# Patient Record
Sex: Female | Born: 1980 | Race: Black or African American | Hispanic: No | Marital: Single | Smoking: Current some day smoker
Health system: Southern US, Community
[De-identification: ages and names within clinical notes are randomized; demographics above are authoritative.]

## PROBLEM LIST (undated history)

## (undated) DIAGNOSIS — Z789 Other specified health status: Secondary | ICD-10-CM

## (undated) HISTORY — PX: INDUCED ABORTION: SHX677

## (undated) HISTORY — PX: FOOT SURGERY: SHX648

---
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
## 1999-01-01 ENCOUNTER — Ambulatory Visit (HOSPITAL_COMMUNITY): Admission: RE | Admit: 1999-01-01 | Discharge: 1999-01-01 | Payer: Self-pay | Admitting: *Deleted

## 1999-01-26 ENCOUNTER — Inpatient Hospital Stay (HOSPITAL_COMMUNITY): Admission: AD | Admit: 1999-01-26 | Discharge: 1999-01-26 | Payer: Self-pay | Admitting: Obstetrics

## 1999-02-01 ENCOUNTER — Inpatient Hospital Stay (HOSPITAL_COMMUNITY): Admission: AD | Admit: 1999-02-01 | Discharge: 1999-02-01 | Payer: Self-pay | Admitting: Obstetrics

## 1999-02-13 ENCOUNTER — Inpatient Hospital Stay (HOSPITAL_COMMUNITY): Admission: AD | Admit: 1999-02-13 | Discharge: 1999-02-13 | Payer: Self-pay | Admitting: *Deleted

## 1999-03-17 ENCOUNTER — Inpatient Hospital Stay (HOSPITAL_COMMUNITY): Admission: AD | Admit: 1999-03-17 | Discharge: 1999-03-19 | Payer: Self-pay | Admitting: Obstetrics & Gynecology

## 1999-06-23 ENCOUNTER — Emergency Department (HOSPITAL_COMMUNITY): Admission: EM | Admit: 1999-06-23 | Discharge: 1999-06-23 | Payer: Self-pay | Admitting: Emergency Medicine

## 2000-04-18 ENCOUNTER — Emergency Department (HOSPITAL_COMMUNITY): Admission: EM | Admit: 2000-04-18 | Discharge: 2000-04-18 | Payer: Self-pay | Admitting: Emergency Medicine

## 2000-04-19 ENCOUNTER — Encounter: Payer: Self-pay | Admitting: Emergency Medicine

## 2000-04-19 ENCOUNTER — Emergency Department (HOSPITAL_COMMUNITY): Admission: EM | Admit: 2000-04-19 | Discharge: 2000-04-19 | Payer: Self-pay | Admitting: *Deleted

## 2000-04-21 ENCOUNTER — Emergency Department (HOSPITAL_COMMUNITY): Admission: EM | Admit: 2000-04-21 | Discharge: 2000-04-21 | Payer: Self-pay | Admitting: Emergency Medicine

## 2001-02-09 ENCOUNTER — Emergency Department (HOSPITAL_COMMUNITY): Admission: EM | Admit: 2001-02-09 | Discharge: 2001-02-09 | Payer: Self-pay

## 2004-01-05 ENCOUNTER — Emergency Department (HOSPITAL_COMMUNITY): Admission: EM | Admit: 2004-01-05 | Discharge: 2004-01-05 | Payer: Self-pay | Admitting: Emergency Medicine

## 2004-04-19 ENCOUNTER — Ambulatory Visit (HOSPITAL_COMMUNITY): Admission: RE | Admit: 2004-04-19 | Discharge: 2004-04-19 | Payer: Self-pay | Admitting: *Deleted

## 2004-05-18 ENCOUNTER — Ambulatory Visit (HOSPITAL_COMMUNITY): Admission: RE | Admit: 2004-05-18 | Discharge: 2004-05-18 | Payer: Self-pay | Admitting: *Deleted

## 2004-05-27 ENCOUNTER — Inpatient Hospital Stay (HOSPITAL_COMMUNITY): Admission: AD | Admit: 2004-05-27 | Discharge: 2004-05-27 | Payer: Self-pay | Admitting: *Deleted

## 2004-06-13 ENCOUNTER — Inpatient Hospital Stay (HOSPITAL_COMMUNITY): Admission: AD | Admit: 2004-06-13 | Discharge: 2004-06-13 | Payer: Self-pay | Admitting: Obstetrics and Gynecology

## 2004-06-14 ENCOUNTER — Ambulatory Visit (HOSPITAL_COMMUNITY): Admission: RE | Admit: 2004-06-14 | Discharge: 2004-06-14 | Payer: Self-pay | Admitting: *Deleted

## 2004-10-09 ENCOUNTER — Ambulatory Visit: Payer: Self-pay | Admitting: *Deleted

## 2004-10-09 ENCOUNTER — Inpatient Hospital Stay (HOSPITAL_COMMUNITY): Admission: AD | Admit: 2004-10-09 | Discharge: 2004-10-11 | Payer: Self-pay | Admitting: *Deleted

## 2004-10-15 ENCOUNTER — Inpatient Hospital Stay (HOSPITAL_COMMUNITY): Admission: AD | Admit: 2004-10-15 | Discharge: 2004-10-16 | Payer: Self-pay | Admitting: Family Medicine

## 2005-09-23 IMAGING — US US OB FOLLOW-UP
1 series · 14 of 28 positions shown · non-contrast
Comparison: none

CLINICAL DATA: 17 week 6 day gestational age by LMP.  Evaluate fetal anatomy.

[Series 1: us ob follow-up · 0.29mm/px · 14 of 69 slices shown]
[im 3/69]
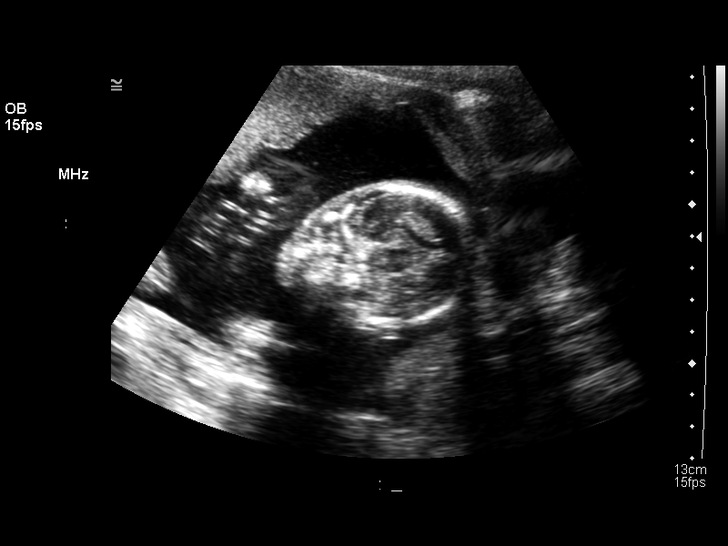
[im 8/69]
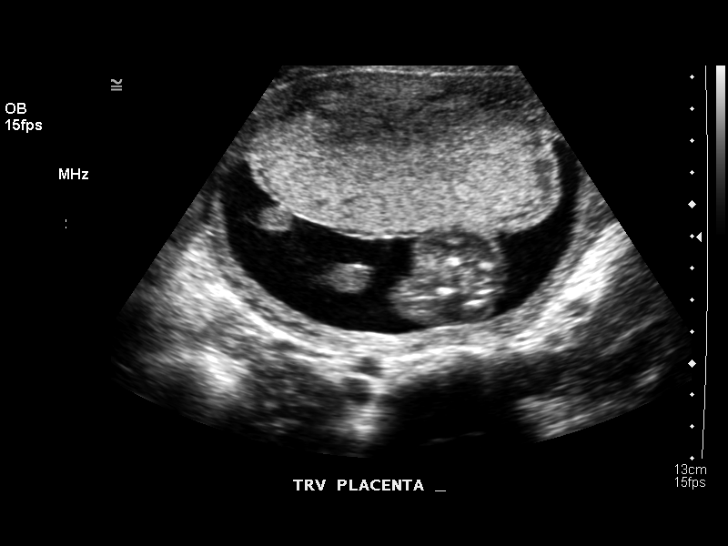
[im 13/69]
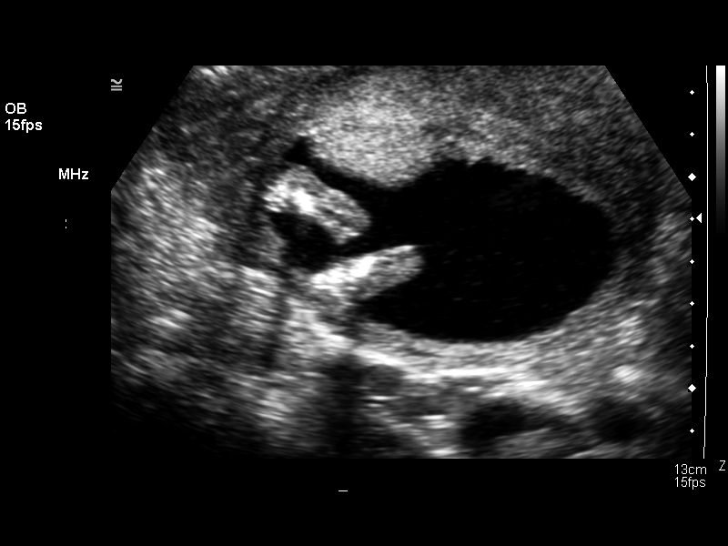
[im 18/69]
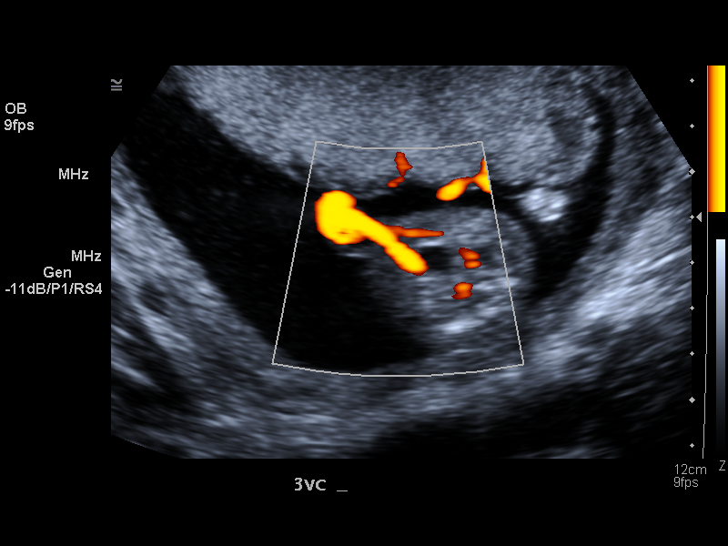
[im 23/69]
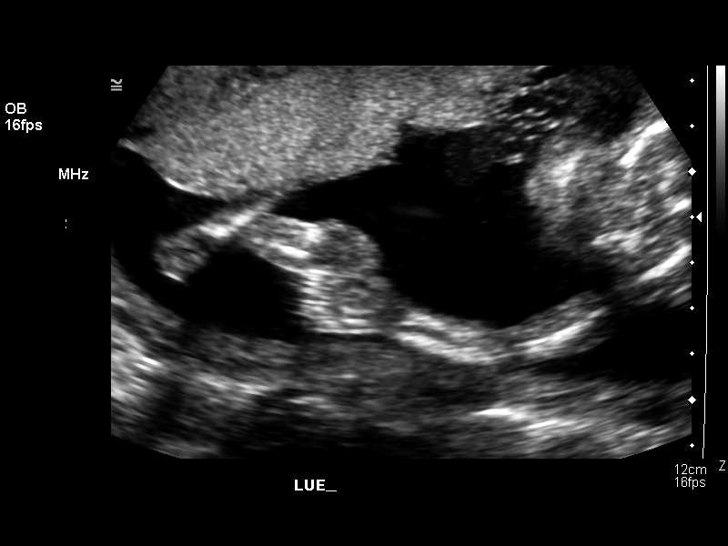
[im 28/69]
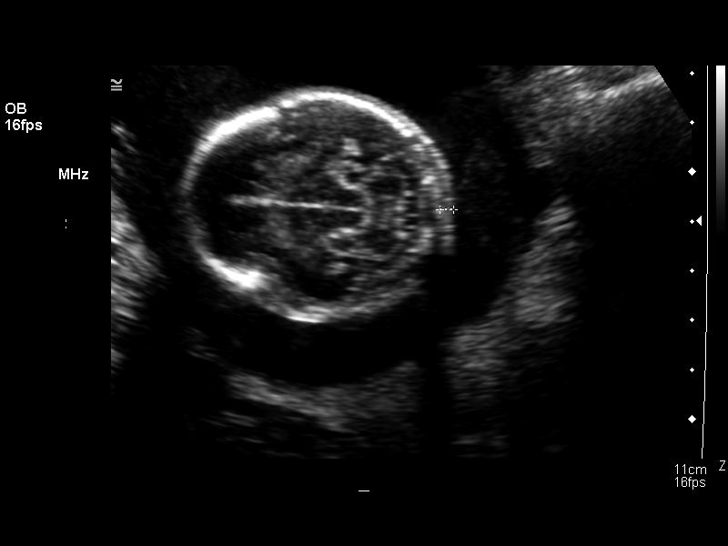
[im 33/69]
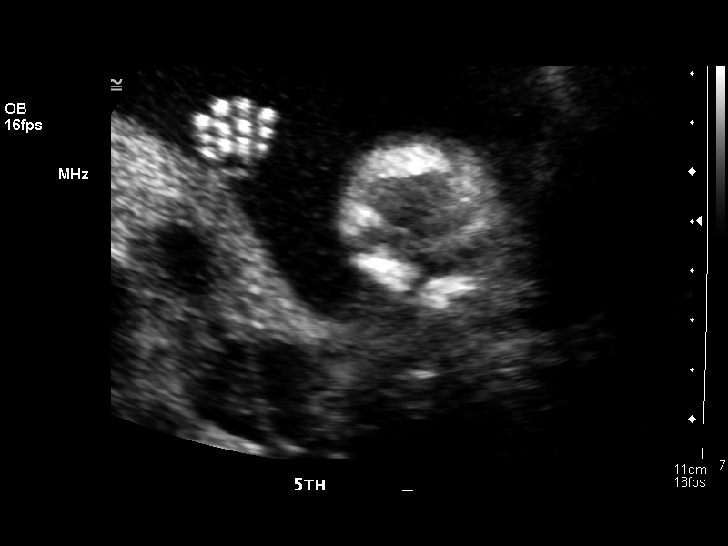
[im 38/69]
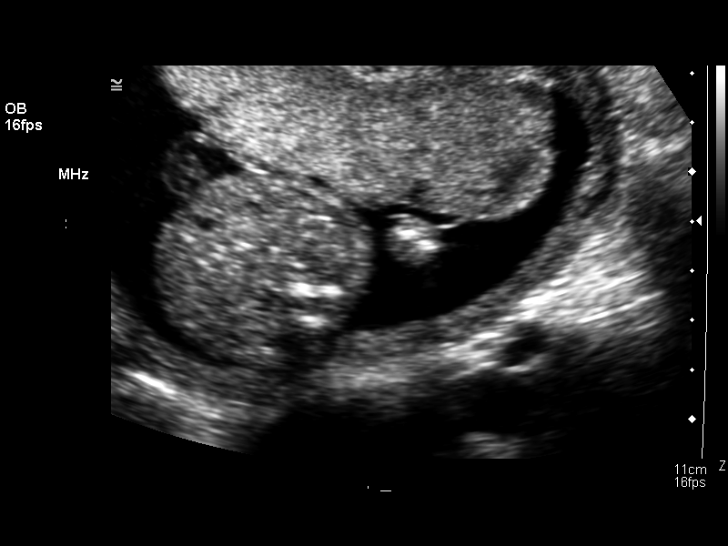
[im 43/69]
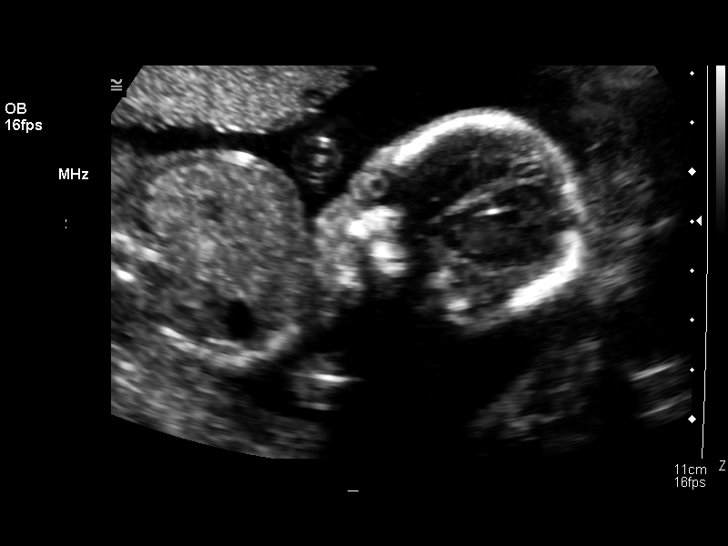
[im 48/69]
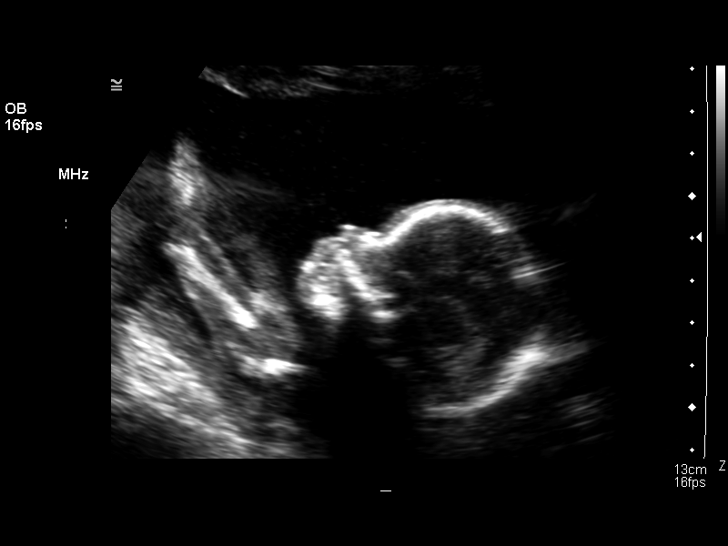
[im 53/69]
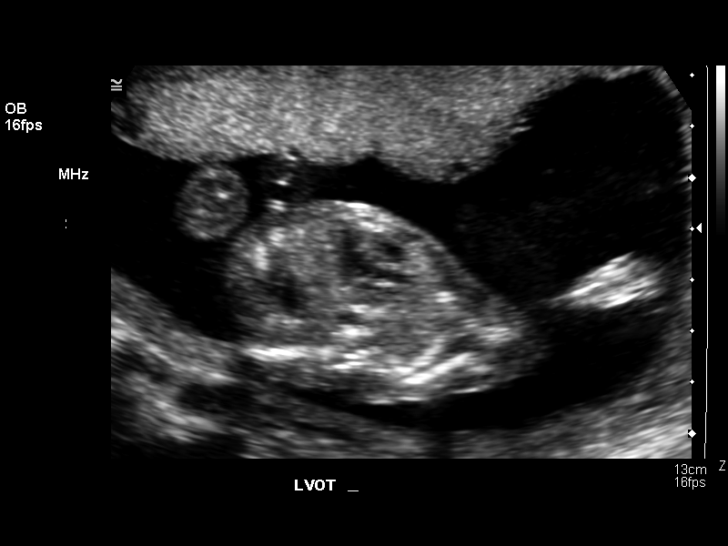
[im 58/69]
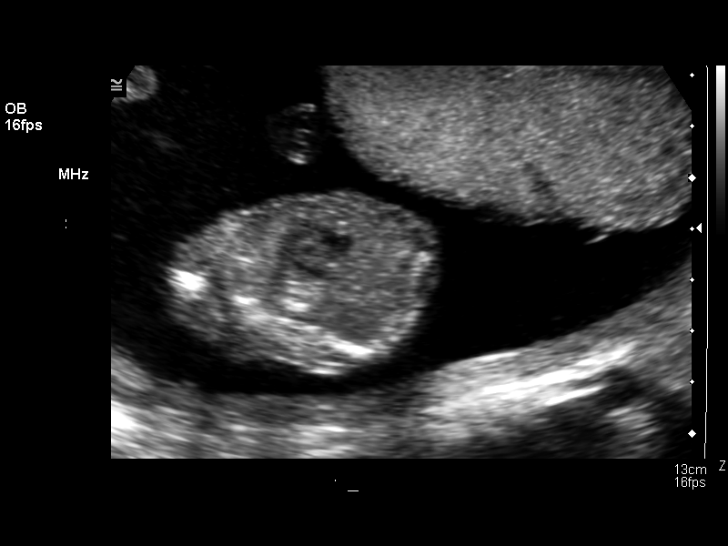
[im 63/69]
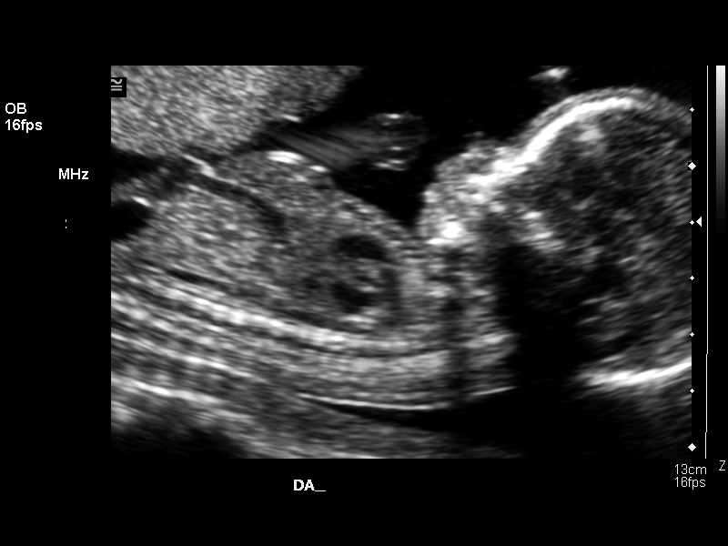
[im 69/69]
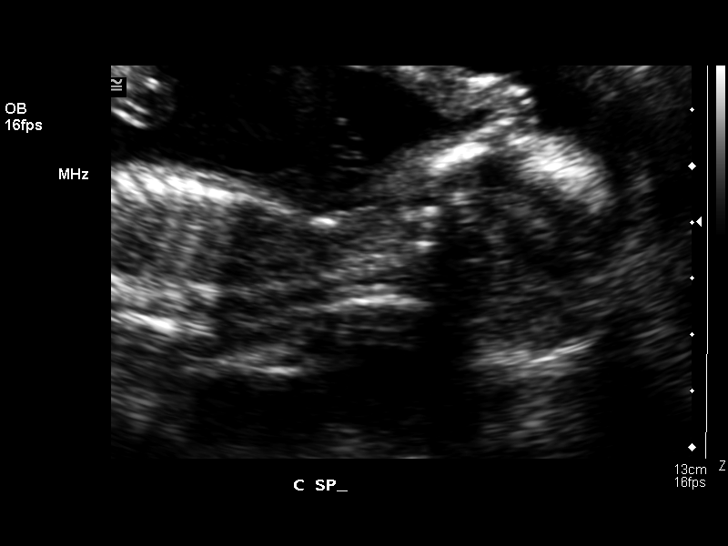

[14 of 28 positions shown; findings below may reference images not displayed]

OBSTETRICAL ULTRASOUND RE-EVALUATION:
 Number of Fetuses: 1
 Heart Rate:  160
 Movement:  Yes
 Breathing:  No
 Presentation:  Cephalic
 Placental Location:  Anterior
 Grade:  I
 Previa:  No
 Amniotic Fluid (subjective):  Normal
 Amniotic Fluid (objective):  4.2 cm Vertical pocket 

 FETAL BIOMETRY WAS NOT REQUESTED
 FETAL ANATOMY
 Lateral Ventricles:  Visualized 
 Thalami/CSP:  Visualized 
 Posterior Fossa:  Visualized 
 Nuchal Region:  Visualized 
 Spine:  Visualized 
 4 Chamber Heart on Left:  Visualized 
 Stomach on Left:  Visualized 
 3 Vessel Cord:  Visualized 
 Cord Insertion Site:  Visualized 
 Kidneys:  Visualized 
 Bladder:  Visualized 
 Extremities:  Visualized 

 ADDITIONAL ANATOMY VISUALIZED:  LVOT, RVOT, upper lip, orbits, profile, diaphragm, heel, 5th digit, ductal arch, and male genitalia.

 MATERNAL UTERINE AND ADNEXAL FINDINGS
 Cervix:  4.1 cm Transabdominally
IMPRESSION: 1.  Single living intrauterine fetus in cephalic presentation with assigned gestational age of 17 weeks 6 days.  
 2.  No evidence of fetal anatomic abnormality.

## 2008-06-07 ENCOUNTER — Emergency Department (HOSPITAL_COMMUNITY): Admission: EM | Admit: 2008-06-07 | Discharge: 2008-06-07 | Payer: Self-pay | Admitting: Emergency Medicine

## 2009-04-10 ENCOUNTER — Emergency Department (HOSPITAL_COMMUNITY): Admission: EM | Admit: 2009-04-10 | Discharge: 2009-04-10 | Payer: Self-pay | Admitting: Emergency Medicine

## 2010-07-16 LAB — URINALYSIS, ROUTINE W REFLEX MICROSCOPIC
Ketones, ur: 15 mg/dL — AB
Nitrite: NEGATIVE
Protein, ur: NEGATIVE mg/dL
Urobilinogen, UA: 1 mg/dL (ref 0.0–1.0)

## 2010-11-04 ENCOUNTER — Emergency Department (HOSPITAL_COMMUNITY): Payer: Medicaid Other

## 2010-11-04 ENCOUNTER — Emergency Department (HOSPITAL_COMMUNITY)
Admission: EM | Admit: 2010-11-04 | Discharge: 2010-11-04 | Disposition: A | Discharge status: Home / Self Care | Payer: Medicaid Other | Attending: Emergency Medicine | Admitting: Emergency Medicine

## 2010-11-04 DIAGNOSIS — R079 Chest pain, unspecified: Secondary | ICD-10-CM | POA: Insufficient documentation

## 2010-11-04 DIAGNOSIS — J189 Pneumonia, unspecified organism: Secondary | ICD-10-CM | POA: Insufficient documentation

## 2010-11-04 DIAGNOSIS — F17200 Nicotine dependence, unspecified, uncomplicated: Secondary | ICD-10-CM | POA: Insufficient documentation

## 2010-11-04 DIAGNOSIS — K219 Gastro-esophageal reflux disease without esophagitis: Secondary | ICD-10-CM | POA: Insufficient documentation

## 2010-11-04 LAB — DIFFERENTIAL
Basophils Relative: 0 % (ref 0–1)
Eosinophils Absolute: 0.1 10*3/uL (ref 0.0–0.7)
Monocytes Absolute: 1.1 10*3/uL — ABNORMAL HIGH (ref 0.1–1.0)
Monocytes Relative: 8 % (ref 3–12)
Neutro Abs: 10 10*3/uL — ABNORMAL HIGH (ref 1.7–7.7)

## 2010-11-04 LAB — CBC
Hemoglobin: 13.6 g/dL (ref 12.0–15.0)
MCH: 33.2 pg (ref 26.0–34.0)
MCHC: 35.1 g/dL (ref 30.0–36.0)

## 2010-11-04 LAB — COMPREHENSIVE METABOLIC PANEL
AST: 28 U/L (ref 0–37)
CO2: 27 mEq/L (ref 19–32)
Calcium: 9.5 mg/dL (ref 8.4–10.5)
Creatinine, Ser: 0.9 mg/dL (ref 0.50–1.10)
GFR calc Af Amer: >60 mL/min (ref >60)
GFR calc non Af Amer: >60 mL/min (ref >60)

## 2010-11-04 LAB — LIPASE, BLOOD: Lipase: 17 U/L (ref 11–59)

## 2010-11-04 LAB — D-DIMER, QUANTITATIVE: D-Dimer, Quant: 0.53 ug/mL-FEU — ABNORMAL HIGH (ref 0.00–0.48)

## 2010-11-04 MED ORDER — IOHEXOL 300 MG/ML  SOLN
100.0000 mL | Freq: Once | INTRAMUSCULAR | Status: AC | PRN
  Administered 2010-11-04: 100 mL via INTRAVENOUS

## 2011-04-07 ENCOUNTER — Emergency Department (HOSPITAL_COMMUNITY)
Admission: EM | Admit: 2011-04-07 | Discharge: 2011-04-07 | Discharge status: Against Medical Advice | Payer: Self-pay | Attending: *Deleted | Admitting: *Deleted

## 2011-04-07 DIAGNOSIS — Z049 Encounter for examination and observation for unspecified reason: Secondary | ICD-10-CM | POA: Insufficient documentation

## 2011-04-07 NOTE — ED Notes (Signed)
Pt called x 3 no answer 

## 2011-04-07 NOTE — ED Notes (Signed)
Called patient and now answer x 2

## 2011-04-09 ENCOUNTER — Emergency Department (HOSPITAL_COMMUNITY)
Admission: EM | Admit: 2011-04-09 | Discharge: 2011-04-09 | Disposition: A | Discharge status: Home / Self Care | Payer: Self-pay | Attending: Emergency Medicine | Admitting: Emergency Medicine

## 2011-04-09 ENCOUNTER — Encounter: Payer: Self-pay | Admitting: *Deleted

## 2011-04-09 DIAGNOSIS — J020 Streptococcal pharyngitis: Secondary | ICD-10-CM | POA: Insufficient documentation

## 2011-04-09 DIAGNOSIS — R509 Fever, unspecified: Secondary | ICD-10-CM | POA: Insufficient documentation

## 2011-04-09 DIAGNOSIS — IMO0001 Reserved for inherently not codable concepts without codable children: Secondary | ICD-10-CM | POA: Insufficient documentation

## 2011-04-09 DIAGNOSIS — R51 Headache: Secondary | ICD-10-CM | POA: Insufficient documentation

## 2011-04-09 DIAGNOSIS — R5381 Other malaise: Secondary | ICD-10-CM | POA: Insufficient documentation

## 2011-04-09 DIAGNOSIS — R070 Pain in throat: Secondary | ICD-10-CM | POA: Insufficient documentation

## 2011-04-09 DIAGNOSIS — R599 Enlarged lymph nodes, unspecified: Secondary | ICD-10-CM | POA: Insufficient documentation

## 2011-04-09 LAB — RAPID STREP SCREEN (MED CTR MEBANE ONLY): Streptococcus, Group A Screen (Direct): POSITIVE — AB

## 2011-04-09 MED ORDER — ACETAMINOPHEN 325 MG PO TABS
650.0000 mg | ORAL_TABLET | Freq: Once | ORAL | Status: AC
  Administered 2011-04-09: 650 mg via ORAL
  Filled 2011-04-09: qty 1

## 2011-04-09 MED ORDER — PENICILLIN V POTASSIUM 500 MG PO TABS: 500.0000 mg | ORAL_TABLET | Freq: Four times a day (QID) | ORAL | Status: AC

## 2011-04-09 MED ORDER — PENICILLIN V POTASSIUM 500 MG PO TABS: 500.0000 mg | ORAL_TABLET | Freq: Two times a day (BID) | ORAL | Status: DC

## 2011-04-09 MED ORDER — PENICILLIN V POTASSIUM 250 MG PO TABS
500.0000 mg | ORAL_TABLET | Freq: Four times a day (QID) | ORAL | Status: DC
  Administered 2011-04-09: 500 mg via ORAL
  Filled 2011-04-09: qty 2

## 2011-04-09 NOTE — ED Notes (Signed)
Patient states that her throat hurts and her tonsils are swollen for two days.  Patient is experiencing fever with chills

## 2011-04-09 NOTE — ED Notes (Signed)
States fever up to 102 x 2 days.

## 2011-04-09 NOTE — ED Provider Notes (Signed)
History     CSN: [id number]  Arrival date & time 04/09/11  1716   First MD Initiated Contact with Patient 04/09/11 1729      Chief Complaint  Patient presents with  . throat pain     (Consider location/radiation/quality/duration/timing/severity/associated sxs/prior treatment) Patient is a 30 y.o. female presenting with pharyngitis. The history is provided by the patient.  Sore Throat This is a new problem. Episode onset: Two days ago. The problem occurs constantly. The problem has been gradually worsening. Associated symptoms include chills, fatigue, a fever, headaches, myalgias, a sore throat and swollen glands. Pertinent negatives include no abdominal pain, chest pain, congestion, coughing, nausea, neck pain, rash, visual change or vomiting. The symptoms are aggravated by swallowing. She has tried nothing for the symptoms.    History reviewed. No pertinent past medical history.  History reviewed. No pertinent past surgical history.  History reviewed. No pertinent family history.  History  Substance Use Topics  . Smoking status: Current Some Day Smoker    Types: Cigarettes  . Smokeless tobacco: Not on file  . Alcohol Use: Yes    OB History    Grav Para Term Preterm Abortions TAB SAB Ect Mult Living                  Review of Systems  Constitutional: Positive for fever, chills and fatigue.  HENT: Positive for sore throat. Negative for ear pain, congestion, rhinorrhea, drooling, neck pain, neck stiffness, voice change and sinus pressure.   Eyes: Negative for visual disturbance.  Respiratory: Negative for cough.   Cardiovascular: Negative for chest pain.  Gastrointestinal: Negative for nausea, vomiting, abdominal pain and diarrhea.  Genitourinary: Negative for dysuria.  Musculoskeletal: Positive for myalgias.  Skin: Negative for rash.  Neurological: Positive for headaches. Negative for dizziness, syncope and light-headedness.    Allergies  Review of patient's  allergies indicates no known allergies.  Home Medications   Current Outpatient Rx  Name Route Sig Dispense Refill  . DM-PHENYLEPHRINE-ACETAMINOPHEN 10-5-325 MG PO CAPS Oral Take 1 capsule by mouth daily.      . GUAIFENESIN ER 600 MG PO TB12 Oral Take 1,200 mg by mouth 2 (two) times daily.      Marland Kitchen PENICILLIN V POTASSIUM 500 MG PO TABS Oral Take 1 tablet (500 mg total) by mouth 4 (four) times daily. 20 tablet 0    BP 116/77  Pulse 84  Temp(Src) 99.6 F (37.6 C) (Oral)  Resp 20  SpO2 99%  Physical Exam  Nursing note and vitals reviewed. Constitutional: She is oriented to person, place, and time. She appears well-developed and well-nourished. No distress.  HENT:  Head: Normocephalic and atraumatic. No trismus in the jaw.  Right Ear: Tympanic membrane and ear canal normal.  Left Ear: Tympanic membrane and ear canal normal.  Nose: Nose normal. Right sinus exhibits no maxillary sinus tenderness and no frontal sinus tenderness. Left sinus exhibits no maxillary sinus tenderness and no frontal sinus tenderness.  Mouth/Throat: Uvula is midline. No uvula swelling. Posterior oropharyngeal erythema present.       Tonsillar exudates bilaterally.  No uvula deviation, no trismus, or signs of peritonsillar abscess. No muffled voice.    Neck: Normal range of motion. Neck supple.  Cardiovascular: Normal rate, regular rhythm and normal heart sounds.   Pulmonary/Chest: Effort normal and breath sounds normal.  Abdominal: Soft. Bowel sounds are normal.  Neurological: She is alert and oriented to person, place, and time.  Skin: Skin is warm and dry. No  rash noted. She is not diaphoretic.  Psychiatric: She has a normal mood and affect.    ED Course  Procedures (including critical care time)  Labs Reviewed  RAPID STREP SCREEN - Abnormal; Notable for the following:    Streptococcus, Group A Screen (Direct) POSITIVE (*)    All other components within normal limits   No results found.   1. Strep  throat       MDM    Patient with pharyngeal erythema, mildly enlarged tonsils, anterior cervical lymphadenopathy, in the absence of cough.  Therefore, patient was treated with antibiotic PCN.  Patient not having any difficulty swallowing        Pascal Lux [facility] : [facility] 04/11/11 2033

## 2011-04-12 NOTE — ED Provider Notes (Signed)
Medical screening examination/treatment/procedure(s) were performed by non-physician practitioner and as supervising physician I was immediately available for consultation/collaboration.   Dayton Bailiff, MD 04/12/11 [phone]

## 2011-08-19 ENCOUNTER — Encounter (HOSPITAL_COMMUNITY): Payer: Self-pay | Admitting: *Deleted

## 2011-08-19 ENCOUNTER — Emergency Department (HOSPITAL_COMMUNITY)
Admission: EM | Admit: 2011-08-19 | Discharge: 2011-08-19 | Disposition: A | Discharge status: Home / Self Care | Payer: Medicaid Other | Attending: Emergency Medicine | Admitting: Emergency Medicine

## 2011-08-19 DIAGNOSIS — S91309A Unspecified open wound, unspecified foot, initial encounter: Secondary | ICD-10-CM | POA: Insufficient documentation

## 2011-08-19 DIAGNOSIS — S61419A Laceration without foreign body of unspecified hand, initial encounter: Secondary | ICD-10-CM

## 2011-08-19 DIAGNOSIS — W268XXA Contact with other sharp object(s), not elsewhere classified, initial encounter: Secondary | ICD-10-CM | POA: Insufficient documentation

## 2011-08-19 DIAGNOSIS — F17200 Nicotine dependence, unspecified, uncomplicated: Secondary | ICD-10-CM | POA: Insufficient documentation

## 2011-08-19 DIAGNOSIS — S61409A Unspecified open wound of unspecified hand, initial encounter: Secondary | ICD-10-CM | POA: Insufficient documentation

## 2011-08-19 DIAGNOSIS — S91319A Laceration without foreign body, unspecified foot, initial encounter: Secondary | ICD-10-CM

## 2011-08-19 MED ORDER — HYDROCODONE-ACETAMINOPHEN 5-500 MG PO TABS: 1.0000 | ORAL_TABLET | Freq: Four times a day (QID) | ORAL | Status: AC | PRN

## 2011-08-19 MED ORDER — CEPHALEXIN 500 MG PO CAPS: 500.0000 mg | ORAL_CAPSULE | Freq: Four times a day (QID) | ORAL | Status: AC

## 2011-08-19 MED ORDER — HYDROCODONE-ACETAMINOPHEN 5-500 MG PO TABS: 1.0000 | ORAL_TABLET | Freq: Four times a day (QID) | ORAL | Status: DC | PRN

## 2011-08-19 NOTE — ED Provider Notes (Signed)
This chart was scribed for Gwyneth Sprout, MD by Williemae Natter. The patient was seen in room STRE6/STRE6 at 5:11 PM.  History     CSN: [id number]  Arrival date & time 08/19/11  1601   First MD Initiated Contact with Patient 08/19/11 1709      Chief Complaint  Patient presents with  . Laceration    (Consider location/radiation/quality/duration/timing/severity/associated sxs/prior treatment) HPI Andrea Wolfe is a 31 y.o. female who presents to the Emergency Department complaining of lacerations. Pt cut her right foot and left hand on a lamp 3 days ago. Pt cared for wounds at home but still has pain in right foot. Pt has trouble walking and cannot put weight on it. Pt had a tetanus shot last year. Associated redness, warmth, and swelling around laceration.  History reviewed. No pertinent past medical history.  Past Surgical History  Procedure Date  . Foot surgery     No family history on file.  History  Substance Use Topics  . Smoking status: Current Some Day Smoker    Types: Cigarettes  . Smokeless tobacco: Not on file  . Alcohol Use: Yes    OB History    Grav Para Term Preterm Abortions TAB SAB Ect Mult Living                  Review of Systems  Constitutional: Negative for fever and chills.  Respiratory: Negative for shortness of breath.   Gastrointestinal: Negative for nausea and vomiting.  Skin: Positive for wound (to right foot and left hand).  Neurological: Negative for weakness.  All other systems reviewed and are negative.    Allergies  Review of patient's allergies indicates no known allergies.  Home Medications  No current outpatient prescriptions on file.  BP 106/59  Pulse 59  Temp(Src) 98.1 F (36.7 C) (Oral)  Resp 18  SpO2 100%  LMP 07/26/2011  Physical Exam  Nursing note and vitals reviewed. Constitutional: She is oriented to person, place, and time. She appears well-developed and well-nourished. No distress.  HENT:  Head:  Normocephalic and atraumatic.  Eyes: EOM are normal.  Neck: Normal range of motion. Neck supple. No tracheal deviation present.  Cardiovascular: Normal rate, regular rhythm and normal heart sounds.   Pulmonary/Chest: Effort normal and breath sounds normal. No respiratory distress.  Musculoskeletal: Normal range of motion.  Neurological: She is alert and oriented to person, place, and time.  Skin: Skin is warm and dry.       Warm, erythematous 4 cm laceration to left great toe. No pus drainage. Wound appears to be healing appropriately. Left hand-healing 3 cm lac in the thenar region. No warmth, erythema, or signs of infections  Psychiatric: She has a normal mood and affect. Her behavior is normal.    ED Course  Procedures (including critical care time)  Labs Reviewed - No data to display No results found.   1. Laceration of foot   2. Laceration of hand       MDM   Patient with a laceration to her foot and hand that occurred 2 days ago. Wound appears to be healing however on the foot there is some surrounding erythema which may be early infection. Patient states she was cut by glass but there was no glass in her wound and there's no sign of foreign body currently. Patient's tetanus status is up-to-date and she was given instructions for wound care and started on an antibiotic.  I personally performed the services described in  this documentation, which was scribed in my presence.  The recorded information has been reviewed and considered.        Gwyneth Sprout, MD 08/19/11 1729

## 2011-08-19 NOTE — ED Notes (Signed)
Pt cut her right foot and her left hand on a lamp 2 days ago.  Pt was attempting to care for the wounds at home but continues to have pain.  Lacerations are not approximated, not draining at this time.  Some swelling in the area around lacerations

## 2011-08-19 NOTE — ED Notes (Signed)
Pt reports used iodine and peroxide to clean

## 2011-08-19 NOTE — ED Notes (Signed)
Ortho called for crutches 

## 2011-08-19 NOTE — Discharge Instructions (Signed)

## 2011-08-19 NOTE — Progress Notes (Signed)
Orthopedic Tech Progress Note Patient Details:  Andrea Wolfe [birth year] [id number]  Other Ortho Devices Type of Ortho Device: Crutches Ortho Device Interventions: Application   Nikki Dom 08/19/2011, 5:58 PM

## 2012-01-23 ENCOUNTER — Encounter (HOSPITAL_COMMUNITY): Payer: Self-pay | Admitting: Emergency Medicine

## 2012-01-23 ENCOUNTER — Emergency Department (HOSPITAL_COMMUNITY)
Admission: EM | Admit: 2012-01-23 | Discharge: 2012-01-23 | Disposition: A | Discharge status: Home Health | Payer: Medicaid Other | Attending: Emergency Medicine | Admitting: Emergency Medicine

## 2012-01-23 DIAGNOSIS — F17200 Nicotine dependence, unspecified, uncomplicated: Secondary | ICD-10-CM | POA: Insufficient documentation

## 2012-01-23 DIAGNOSIS — J039 Acute tonsillitis, unspecified: Secondary | ICD-10-CM

## 2012-01-23 LAB — RAPID STREP SCREEN (MED CTR MEBANE ONLY): Streptococcus, Group A Screen (Direct): NEGATIVE

## 2012-01-23 MED ORDER — ACETAMINOPHEN 325 MG PO TABS
650.0000 mg | ORAL_TABLET | Freq: Once | ORAL | Status: AC
  Administered 2012-01-23: 650 mg via ORAL
  Filled 2012-01-23: qty 2

## 2012-01-23 MED ORDER — AZITHROMYCIN 250 MG PO TABS: ORAL_TABLET | ORAL | Status: DC

## 2012-01-23 NOTE — ED Notes (Signed)
Pt d/c'd with instructions verbalizes

## 2012-01-23 NOTE — ED Notes (Signed)
Pt c/o generalized body aches and chills x 3 days with sore throat

## 2012-01-23 NOTE — ED Provider Notes (Signed)
History  This chart was scribed for Flint Melter, MD by Ardeen Jourdain. This patient was seen in room TR09C/TR09C and the patient's care was started at 1112.  CSN: [id number]  Arrival date & time 01/23/12  7829   First MD Initiated Contact with Patient 01/23/12 1112      Chief Complaint  Patient presents with  . Generalized Body Aches  . Sore Throat     The history is provided by the patient. No language interpreter was used.    Andrea Wolfe is a 31 y.o. female who presents to the Emergency Department complaining of a sore throat with associated headache productive cough, weakness, body aches, nausea, emesis and diarrahea. She states that the symptoms started a week ago and have gradually gotten worse. She states that the cough is aggravated by smoking and produces a yellow, blood streaked mucus. She states that she has not eaten this morning. She denies having emesis or diarrhea today. She denies pain in her face and ears. She states she has been taking ibuprofen the past week with no relief. She denies working recently.    History reviewed. No pertinent past medical history.  Past Surgical History  Procedure Date  . Foot surgery     History reviewed. No pertinent family history.  History  Substance Use Topics  . Smoking status: Current Some Day Smoker    Types: Cigarettes  . Smokeless tobacco: Not on file  . Alcohol Use: Yes   No OB History available.   Review of Systems  HENT: Positive for sore throat and trouble swallowing.   Respiratory: Positive for cough.   Gastrointestinal: Positive for nausea, vomiting and diarrhea.  All other systems reviewed and are negative.    Allergies  Review of patient's allergies indicates no known allergies.  Home Medications   Current Outpatient Rx  Name Route Sig Dispense Refill  . IBUPROFEN 200 MG PO TABS Oral Take 200 mg by mouth every 6 (six) hours as needed. For pain    . AZITHROMYCIN 250 MG PO TABS  2 po day one,  then 1 daily x 4 days 5 tablet 0    Triage Vitals: BP 116/75  Pulse 93  Temp 97.2 F (36.2 C) (Oral)  Resp 20  SpO2 99%  Physical Exam  Nursing note and vitals reviewed. Constitutional: She is oriented to person, place, and time. She appears well-developed and well-nourished. No distress.  HENT:  Head: Normocephalic and atraumatic.  Mouth/Throat: Oropharyngeal exudate present.       Tonsils erythematic, tender and swollen bilaterally; exudate on left tonsil, tender jugular diagastric nodes bilaterally  Eyes: EOM are normal. Pupils are equal, round, and reactive to light.  Neck: Normal range of motion. Neck supple. No tracheal deviation present.  Cardiovascular: Normal rate, regular rhythm and normal heart sounds.   Pulmonary/Chest: Effort normal and breath sounds normal. No respiratory distress.  Abdominal: Soft. She exhibits no distension.  Musculoskeletal: Normal range of motion. She exhibits no edema.  Neurological: She is alert and oriented to person, place, and time.  Skin: Skin is warm and dry.  Psychiatric: She has a normal mood and affect. Her behavior is normal.    ED Course  Procedures (including critical care time)  DIAGNOSTIC STUDIES: Oxygen Saturation is 99% on room air, normal by my interpretation.    COORDINATION OF CARE:  1142- Discussed treatment plan with pt at bedside and pt agreed to plan.     Labs Reviewed  RAPID STREP SCREEN  1. Tonsillitis       MDM  Tonsillitis with bronchitis. Doubt metabolic instability, serious bacterial infection or impending vascular collapse; the patient is stable for discharge.     I personally performed the services described in this documentation, which was scribed in my presence. The recorded information has been reviewed and considered.     Flint Melter, MD 01/23/12 1438

## 2012-04-15 NOTE — L&D Delivery Note (Signed)
Delivery Note  At 11:43 AM a viable female named Andrea Wolfe was delivered via Vaginal, Spontaneous Delivery (Presentation: ; Occiput Anterior).  APGAR: 9, 9 Placenta status: Intact, Spontaneous.  Cord: 3 vessels with the following complications: None.    Anesthesia: None  Episiotomy: None Lacerations: None  Est. Blood Loss (mL): 100  Mom to postpartum.  Baby to nursery-stable.  Tiyanna Larcom A 01/20/2013, 12:20 PM

## 2012-06-17 ENCOUNTER — Encounter (HOSPITAL_COMMUNITY): Payer: Self-pay | Admitting: *Deleted

## 2012-06-17 ENCOUNTER — Inpatient Hospital Stay (HOSPITAL_COMMUNITY)
Admission: AD | Admit: 2012-06-17 | Discharge: 2012-06-17 | Disposition: A | Discharge status: Home / Self Care | Payer: Medicaid Other | Source: Ambulatory Visit | Attending: Obstetrics and Gynecology | Admitting: Obstetrics and Gynecology

## 2012-06-17 ENCOUNTER — Inpatient Hospital Stay (HOSPITAL_COMMUNITY): Payer: Medicaid Other

## 2012-06-17 DIAGNOSIS — O209 Hemorrhage in early pregnancy, unspecified: Secondary | ICD-10-CM | POA: Insufficient documentation

## 2012-06-17 DIAGNOSIS — R109 Unspecified abdominal pain: Secondary | ICD-10-CM | POA: Insufficient documentation

## 2012-06-17 LAB — URINALYSIS, ROUTINE W REFLEX MICROSCOPIC
Glucose, UA: NEGATIVE mg/dL
Nitrite: NEGATIVE
Protein, ur: NEGATIVE mg/dL
Urobilinogen, UA: 0.2 mg/dL (ref 0.0–1.0)

## 2012-06-17 LAB — CBC
MCH: 31.7 pg (ref 26.0–34.0)
MCHC: 33.8 g/dL (ref 30.0–36.0)
Platelets: 307 10*3/uL (ref 150–400)

## 2012-06-17 LAB — URINE MICROSCOPIC-ADD ON

## 2012-06-17 LAB — RAPID URINE DRUG SCREEN, HOSP PERFORMED
Amphetamines: NOT DETECTED
Cocaine: POSITIVE — AB
Opiates: NOT DETECTED
Tetrahydrocannabinol: POSITIVE — AB

## 2012-06-17 LAB — POCT PREGNANCY, URINE: Preg Test, Ur: POSITIVE — AB

## 2012-06-17 LAB — HCG, QUANTITATIVE, PREGNANCY: hCG, Beta Chain, Quant, S: 40221 m[IU]/mL — ABNORMAL HIGH (ref <5)

## 2012-06-17 MED ORDER — HYDROCODONE-ACETAMINOPHEN 5-325 MG PO TABS
1.0000 | ORAL_TABLET | Freq: Once | ORAL | Status: AC
  Administered 2012-06-17: 1 via ORAL
  Filled 2012-06-17: qty 1

## 2012-06-17 NOTE — MAU Note (Signed)
Pt presents with complaint of no period last month, thinks she was pregnant but did not do a pregnancy test. Yesterday had vomiting and had episode of heavy bleeding and passing clots. Started having lower abd pain and that has continued. Small amount of bleeding now but is not bleeding through her tampon.

## 2012-06-17 NOTE — MAU Provider Note (Signed)
History     CSN: [id number]  Arrival date and time: 06/17/12 1324   First Provider Initiated Contact with Patient 06/17/12 1935      Chief Complaint  Patient presents with  . Possible Pregnancy  . Abdominal Pain   HPI This is a 32 y.o. female at Unknown [place] who presents with c/o missed period last month and bleeding now. Thinks she might be miscarrying. Had some vomiting. Also has some cramping.   RN Note: Pt presents with complaint of no period last month, thinks she was pregnant but did not do a pregnancy test. Yesterday had vomiting and had episode of heavy bleeding and passing clots. Started having lower abd pain and that has continued. Small amount of bleeding now but is not bleeding through her tampon.      [place] tech informed me that the patient told her that she drank alcohol and smoked marijuana everyday.  OB History   Grav Para Term Preterm Abortions TAB SAB Ect Mult Living   4 2 2  1 1    2       No past medical history on file.  Past Surgical History  Procedure Laterality Date  . Foot surgery      Family History  Problem Relation Age of Onset  . Hypertension Mother   . Diabetes Mother     History  Substance Use Topics  . Smoking status: Current Some Day Smoker    Types: Cigarettes  . Smokeless tobacco: Not on file  . Alcohol Use: Yes    Allergies: No Known Allergies  Prescriptions prior to admission  Medication Sig Dispense Refill  . ibuprofen (ADVIL,MOTRIN) 200 MG tablet Take 200 mg by mouth every 6 (six) hours as needed. For pain      . azithromycin (ZITHROMAX Z-PAK) 250 MG tablet 2 po day one, then 1 daily x 4 days  5 tablet  0    Review of Systems  Constitutional: Negative for fever, chills and malaise/fatigue.  Gastrointestinal: Positive for nausea and abdominal pain. Negative for vomiting, diarrhea and constipation.  Genitourinary: Negative for dysuria.   Physical Exam   Blood pressure 117/69, pulse 81, temperature 98.6 F (37 C),  temperature source Oral, resp. rate 18, height 5\' 8"  (1.727 m), weight 161 lb (73.029 kg), last menstrual period 04/23/2012, SpO2 100.00%.  Physical Exam  Constitutional: She is oriented to person, place, and time. She appears well-developed and well-nourished. No distress.  HENT:  Head: Normocephalic.  Cardiovascular: Normal rate.   Respiratory: Effort normal.  GI: Soft. She exhibits no distension and no mass. There is tenderness (over uterus). There is no rebound and no guarding.  Genitourinary: Uterus normal. Vaginal discharge (bloooy mucous, uterus small, adnexae nontender) found.  Musculoskeletal: Normal range of motion.  Neurological: She is alert and oriented to person, place, and time.  Skin: Skin is warm and dry.  Psychiatric: She has a normal mood and affect.    MAU Course  Procedures  MDM Results for orders placed during the hospital encounter of 06/17/12 (from the past 24 hour(s))  URINALYSIS, ROUTINE W REFLEX MICROSCOPIC     Status: Abnormal   Collection Time    06/17/12  7:15 PM      Result Value Range   Color, Urine YELLOW  YELLOW   APPearance CLEAR  CLEAR   Specific Gravity, Urine 1.025  1.005 - 1.030   pH 7.0  5.0 - 8.0   Glucose, UA NEGATIVE  NEGATIVE mg/dL   Hgb urine  dipstick SMALL (*) NEGATIVE   Bilirubin Urine NEGATIVE  NEGATIVE   Ketones, ur NEGATIVE  NEGATIVE mg/dL   Protein, ur NEGATIVE  NEGATIVE mg/dL   Urobilinogen, UA 0.2  0.0 - 1.0 mg/dL   Nitrite NEGATIVE  NEGATIVE   Leukocytes, UA TRACE (*) NEGATIVE  URINE MICROSCOPIC-ADD ON     Status: Abnormal   Collection Time    06/17/12  7:15 PM      Result Value Range   Squamous Epithelial / LPF FEW (*) RARE   WBC, UA 3-6  <3 WBC/hpf   RBC / HPF 7-10  <3 RBC/hpf   Bacteria, UA MANY (*) RARE   Urine-Other TRICHOMONAS PRESENT    POCT PREGNANCY, URINE     Status: Abnormal   Collection Time    06/17/12  7:22 PM      Result Value Range   Preg Test, Ur POSITIVE (*) NEGATIVE  ABO/RH     Status: None    Collection Time    06/17/12  7:40 PM      Result Value Range   ABO/RH(D) O POS    CBC     Status: Abnormal   Collection Time    06/17/12  7:40 PM      Result Value Range   WBC 11.4 (*) 4.0 - 10.5 K/uL   RBC 4.01  3.87 - 5.11 MIL/uL   Hemoglobin 12.7  12.0 - 15.0 g/dL   HCT 16.1  09.6 - 04.5 %   MCV 93.8  78.0 - 100.0 fL   MCH 31.7  26.0 - 34.0 pg   MCHC 33.8  30.0 - 36.0 g/dL   RDW 40.9  81.1 - 91.4 %   Platelets 307  150 - 400 K/uL  HCG, QUANTITATIVE, PREGNANCY     Status: Abnormal   Collection Time    06/17/12  7:40 PM      Result Value Range   hCG, Beta Chain, Quant, S 40221 (*) <5 mIU/mL  WET PREP, GENITAL     Status: Abnormal   Collection Time    06/17/12  7:54 PM      Result Value Range   Yeast Wet Prep HPF POC NONE SEEN  NONE SEEN   Trich, Wet Prep NONE SEEN  NONE SEEN   Clue Cells Wet Prep HPF POC NONE SEEN  NONE SEEN   WBC, Wet Prep HPF POC FEW (*) NONE SEEN   [place] ordered  *RADIOLOGY REPORT*  Clinical Data: Bleeding, early pregnancy. Estimated gestational  age by last menstrual period equals 7 weeks 6 days.  OBSTETRIC <14 WK ULTRASOUND  Technique: Transabdominal ultrasound was performed for evaluation  of the gestation as well as the maternal uterus and adnexal  regions.  Comparison: None.  Intrauterine gestational sac: Present and single  Yolk sac: Present  Embryo: Present  Cardiac Activity: Present  Heart Rate: 128 bpm  CRL: 6.4 mm six w 4 d [place] EDC: 02/06/2013  Maternal uterus/Adnexae:  There are two anechoic cyst associated with the right ovary  measuring 2 cm and 3.4 cm. No nodularity or blood flow. Left  ovary is normal. No free fluid peri  IMPRESSION:  1. Single intrauterine gestation with normal cardiac activity.  2. Estimate gestational age by crown-rump length equals 6 weeks 4  days.  3. Functional ovarian cysts of the right ovary.  Original Report Authenticated By: Genevive Bi, M.D.   Assessment and Plan  A:  Bleeding early  pregnancy P:  Care turned over to [facility]  CNM to await [place] result First trimester danger signs reviewed [facility] as soon as possible.   [facility] 06/17/2012, 8:44 PM

## 2012-06-18 LAB — URINE CULTURE: Colony Count: 9000

## 2012-06-21 NOTE — MAU Provider Note (Signed)
Attestation of Attending Supervision of Advanced Practitioner: Evaluation and management procedures were performed by the PA/NP/CNM/OB Fellow under my supervision/collaboration. Chart reviewed and agree with management and plan.  FERGUSON,JOHN V 06/21/2012 8:41 PM

## 2012-09-18 LAB — OB RESULTS CONSOLE RUBELLA ANTIBODY, IGM: Rubella: IMMUNE

## 2012-09-18 LAB — OB RESULTS CONSOLE HIV ANTIBODY (ROUTINE TESTING): HIV: NONREACTIVE

## 2012-09-18 LAB — OB RESULTS CONSOLE HEPATITIS B SURFACE ANTIGEN: Hepatitis B Surface Ag: NEGATIVE

## 2013-01-12 LAB — OB RESULTS CONSOLE GC/CHLAMYDIA: Chlamydia: NEGATIVE

## 2013-01-20 ENCOUNTER — Inpatient Hospital Stay (HOSPITAL_COMMUNITY)
Admission: AD | Admit: 2013-01-20 | Discharge: 2013-01-22 | DRG: 775 | Disposition: A | Discharge status: Home / Self Care | Payer: Medicaid Other | Source: Ambulatory Visit | Attending: Obstetrics and Gynecology | Admitting: Obstetrics and Gynecology

## 2013-01-20 ENCOUNTER — Encounter (HOSPITAL_COMMUNITY): Payer: Self-pay | Admitting: General Practice

## 2013-01-20 DIAGNOSIS — Z87891 Personal history of nicotine dependence: Secondary | ICD-10-CM

## 2013-01-20 LAB — CBC
Hemoglobin: 11.3 g/dL — ABNORMAL LOW (ref 12.0–15.0)
MCH: 32.8 pg (ref 26.0–34.0)
MCV: 92.8 fL (ref 78.0–100.0)
Platelets: 186 10*3/uL (ref 150–400)
RBC: 3.45 MIL/uL — ABNORMAL LOW (ref 3.87–5.11)
RDW: 13.1 % (ref 11.5–15.5)
WBC: 18.2 10*3/uL — ABNORMAL HIGH (ref 4.0–10.5)

## 2013-01-20 LAB — RPR: RPR Ser Ql: NONREACTIVE

## 2013-01-20 MED ORDER — IBUPROFEN 600 MG PO TABS
600.0000 mg | ORAL_TABLET | Freq: Four times a day (QID) | ORAL | Status: DC
  Administered 2013-01-20 – 2013-01-22 (×8): 600 mg via ORAL
  Filled 2013-01-20 (×8): qty 1

## 2013-01-20 MED ORDER — OXYTOCIN 40 UNITS IN LACTATED RINGERS INFUSION - SIMPLE MED
62.5000 mL/h | INTRAVENOUS | Status: DC
  Filled 2013-01-20: qty 1000

## 2013-01-20 MED ORDER — TETANUS-DIPHTH-ACELL PERTUSSIS 5-2.5-18.5 LF-MCG/0.5 IM SUSP
0.5000 mL | Freq: Once | INTRAMUSCULAR | Status: AC
  Administered 2013-01-20: 0.5 mL via INTRAMUSCULAR

## 2013-01-20 MED ORDER — IBUPROFEN 600 MG PO TABS
600.0000 mg | ORAL_TABLET | Freq: Four times a day (QID) | ORAL | Status: DC | PRN
  Administered 2013-01-20: 600 mg via ORAL
  Filled 2013-01-20: qty 1

## 2013-01-20 MED ORDER — INFLUENZA VAC SPLIT QUAD 0.5 ML IM SUSP
0.5000 mL | INTRAMUSCULAR | Status: AC
  Administered 2013-01-21: 0.5 mL via INTRAMUSCULAR
  Filled 2013-01-20: qty 0.5

## 2013-01-20 MED ORDER — MEASLES, MUMPS & RUBELLA VAC ~~LOC~~ INJ
0.5000 mL | INJECTION | Freq: Once | SUBCUTANEOUS | Status: DC
  Filled 2013-01-20: qty 0.5

## 2013-01-20 MED ORDER — LACTATED RINGERS IV SOLN
INTRAVENOUS | Status: DC
Start: 2013-01-20 — End: 2013-01-22

## 2013-01-20 MED ORDER — LACTATED RINGERS IV SOLN: 500.0000 mL | INTRAVENOUS | Status: DC | PRN

## 2013-01-20 MED ORDER — FENTANYL CITRATE 0.05 MG/ML IJ SOLN
INTRAMUSCULAR | Status: AC
  Filled 2013-01-20: qty 2

## 2013-01-20 MED ORDER — ZOLPIDEM TARTRATE 5 MG PO TABS: 5.0000 mg | ORAL_TABLET | Freq: Every evening | ORAL | Status: DC | PRN

## 2013-01-20 MED ORDER — FENTANYL CITRATE 0.05 MG/ML IJ SOLN
100.0000 ug | INTRAMUSCULAR | Status: DC | PRN
  Administered 2013-01-20: 100 ug via INTRAVENOUS

## 2013-01-20 MED ORDER — ONDANSETRON HCL 4 MG/2ML IJ SOLN: 4.0000 mg | INTRAMUSCULAR | Status: DC | PRN

## 2013-01-20 MED ORDER — SENNOSIDES-DOCUSATE SODIUM 8.6-50 MG PO TABS
2.0000 | ORAL_TABLET | ORAL | Status: DC
  Administered 2013-01-21: 2 via ORAL
  Filled 2013-01-20 (×2): qty 2

## 2013-01-20 MED ORDER — DIBUCAINE 1 % RE OINT: 1.0000 "application " | TOPICAL_OINTMENT | RECTAL | Status: DC | PRN

## 2013-01-20 MED ORDER — FERROUS SULFATE 325 (65 FE) MG PO TABS
325.0000 mg | ORAL_TABLET | Freq: Two times a day (BID) | ORAL | Status: DC
  Administered 2013-01-20 – 2013-01-22 (×4): 325 mg via ORAL
  Filled 2013-01-20 (×5): qty 1

## 2013-01-20 MED ORDER — DIPHENHYDRAMINE HCL 25 MG PO CAPS: 25.0000 mg | ORAL_CAPSULE | Freq: Four times a day (QID) | ORAL | Status: DC | PRN

## 2013-01-20 MED ORDER — SIMETHICONE 80 MG PO CHEW: 80.0000 mg | CHEWABLE_TABLET | ORAL | Status: DC | PRN

## 2013-01-20 MED ORDER — WITCH HAZEL-GLYCERIN EX PADS: 1.0000 "application " | MEDICATED_PAD | CUTANEOUS | Status: DC | PRN

## 2013-01-20 MED ORDER — OXYCODONE-ACETAMINOPHEN 5-325 MG PO TABS
1.0000 | ORAL_TABLET | ORAL | Status: DC | PRN
  Administered 2013-01-20: 2 via ORAL
  Administered 2013-01-20: 1 via ORAL
  Administered 2013-01-21 – 2013-01-22 (×2): 2 via ORAL
  Administered 2013-01-22: 1 via ORAL
  Filled 2013-01-20: qty 1
  Filled 2013-01-20 (×3): qty 2

## 2013-01-20 MED ORDER — PRENATAL MULTIVITAMIN CH
1.0000 | ORAL_TABLET | Freq: Every day | ORAL | Status: DC
  Administered 2013-01-21 – 2013-01-22 (×2): 1 via ORAL
  Filled 2013-01-20 (×2): qty 1

## 2013-01-20 MED ORDER — LIDOCAINE HCL (PF) 1 % IJ SOLN
30.0000 mL | INTRAMUSCULAR | Status: DC | PRN
  Filled 2013-01-20 (×2): qty 30

## 2013-01-20 MED ORDER — ACETAMINOPHEN 325 MG PO TABS: 650.0000 mg | ORAL_TABLET | ORAL | Status: DC | PRN

## 2013-01-20 MED ORDER — CITRIC ACID-SODIUM CITRATE 334-500 MG/5ML PO SOLN: 30.0000 mL | ORAL | Status: DC | PRN

## 2013-01-20 MED ORDER — OXYCODONE-ACETAMINOPHEN 5-325 MG PO TABS
1.0000 | ORAL_TABLET | ORAL | Status: DC | PRN
  Administered 2013-01-21: 1 via ORAL
  Administered 2013-01-21: 2 via ORAL
  Filled 2013-01-20 (×3): qty 2

## 2013-01-20 MED ORDER — NICOTINE 21 MG/24HR TD PT24
21.0000 mg | MEDICATED_PATCH | Freq: Every day | TRANSDERMAL | Status: DC
  Administered 2013-01-20 – 2013-01-22 (×3): 21 mg via TRANSDERMAL
  Filled 2013-01-20 (×3): qty 1

## 2013-01-20 MED ORDER — ONDANSETRON HCL 4 MG PO TABS: 4.0000 mg | ORAL_TABLET | ORAL | Status: DC | PRN

## 2013-01-20 MED ORDER — OXYTOCIN BOLUS FROM INFUSION
500.0000 mL | INTRAVENOUS | Status: DC
  Administered 2013-01-20: 500 mL via INTRAVENOUS

## 2013-01-20 MED ORDER — ONDANSETRON HCL 4 MG/2ML IJ SOLN: 4.0000 mg | Freq: Four times a day (QID) | INTRAMUSCULAR | Status: DC | PRN

## 2013-01-20 MED ORDER — LANOLIN HYDROUS EX OINT: TOPICAL_OINTMENT | CUTANEOUS | Status: DC | PRN

## 2013-01-20 MED ORDER — BENZOCAINE-MENTHOL 20-0.5 % EX AERO: 1.0000 "application " | INHALATION_SPRAY | CUTANEOUS | Status: DC | PRN

## 2013-01-20 NOTE — MAU Note (Signed)
Pt in by ems for c/o leaking fluid and routine labor check

## 2013-01-20 NOTE — H&P (Signed)
Andrea Wolfe is a 32 y.o. female presenting for regular uterine contractions at 37+4 weeks.  OB History   Grav Para Term Preterm Abortions TAB SAB Ect Mult Living   5 2 2  1 1    2      History reviewed. No pertinent past medical history. Past Surgical History  Procedure Laterality Date  . Foot surgery     Family History: family history includes Diabetes in her mother; Hypertension in her mother. Social History:  reports that she has been smoking Cigarettes.  She has been smoking about 0.50 packs per day. She does not have any smokeless tobacco history on file. She reports that she does not drink alcohol or use illicit drugs. Patient reported heavy drinking in 1st trimester and daily MJ use before pregnancy   Prenatal labs: ABO, Rh: --/--/O POS (03/05 1941) Antibody:   Rubella:  Immune RPR:   NR HBsAg:   neg HIV:   neg GBS:  neg  GC/Chlamydia:negative 01/12/13   Prenatal Transfer Tool  Maternal Diabetes: No Genetic Screening: Normal quad screen Maternal Ultrasounds/Referrals: Normal Fetal Ultrasounds or other Referrals:  None Maternal Substance Abuse:  Yes:  Type: Smoker heavy alcohol intake reported by patient in 1st trimester and daily MJ use prior to pregnancy Significant Maternal Medications:  None Significant Maternal Lab Results: None   Dilation: 5.5 Effacement (%): 90 Station: -1 Exam by:: cheryl motte, rn Blood pressure 127/69, pulse 78, temperature 98.1 F (36.7 C), temperature source Oral, resp. rate 20, height 5\' 8"  (1.727 m), weight 189 lb (85.73 kg), last menstrual period 05/03/2012.  General Appearance: Alert, appropriate appearance for age. No acute distress HEENT Exam: Grossly normal Chest/Respiratory Exam: Normal chest wall and respirations. Clear to auscultation  Cardiovascular Exam: Regular rate and rhythm. S1, S2, no murmur Gastrointestinal Exam: soft, non-tender, Uterus gravid with size compatible with GA, Vertex presentation by Leopold's  maneuvers Psychiatric Exam: Alert and oriented, appropriate affect  ++++++++++++++++++++++++++++++++++++++++++++++++++++++++++++++++  Vaginal exam: 7-8 / 100/ 0 BBW  Fetal tracings: Category 1 with occasional mild variable deceleration  ++++++++++++++++++++++++++++++++++++++++++++++++++++++++++++++++   Assessment/Plan:  37+3 weeks Active labor SVD expected Pt consented to UDS  Dois Davenport Andrea Seto MD 01/20/2013, 10:15 AM

## 2013-01-21 LAB — RAPID URINE DRUG SCREEN, HOSP PERFORMED
Amphetamines: NOT DETECTED
Benzodiazepines: NOT DETECTED
Cocaine: NOT DETECTED
Tetrahydrocannabinol: POSITIVE — AB

## 2013-01-21 LAB — CBC
HCT: 32.6 % — ABNORMAL LOW (ref 36.0–46.0)
MCH: 32.4 pg (ref 26.0–34.0)
MCHC: 34.7 g/dL (ref 30.0–36.0)
Platelets: 200 10*3/uL (ref 150–400)
RDW: 13.3 % (ref 11.5–15.5)

## 2013-01-21 MED ORDER — PNEUMOCOCCAL VAC POLYVALENT 25 MCG/0.5ML IJ INJ
0.5000 mL | INJECTION | INTRAMUSCULAR | Status: AC
  Administered 2013-01-22: 0.5 mL via INTRAMUSCULAR
  Filled 2013-01-21: qty 0.5

## 2013-01-21 MED ORDER — MEDROXYPROGESTERONE ACETATE 150 MG/ML IM SUSP
150.0000 mg | Freq: Once | INTRAMUSCULAR | Status: AC
  Administered 2013-01-22: 150 mg via INTRAMUSCULAR
  Filled 2013-01-21: qty 1

## 2013-01-21 NOTE — Progress Notes (Signed)
Post Partum Day 1 Subjective: no complaints, up ad lib, voiding, tolerating PO and no BM yet.  Both BFing and bottle feeding.  She would like depo provera prior to d/c home.    Objective: Blood pressure 117/77, pulse 80, temperature 98 F (36.7 C), temperature source Oral, resp. rate 20, height 5\' 8"  (1.727 m), weight 85.73 kg (189 lb), last menstrual period 05/03/2012, unknown if currently breastfeeding.  Physical Exam:  General: alert and no distress Lochia: appropriate Uterine Fundus: firm Incision: n/a DVT Evaluation: No evidence of DVT seen on physical exam.   Recent Labs  01/20/13 1558 01/21/13 0555  HGB 11.3* 11.3*  HCT 32.0* 32.6*    Assessment/Plan: Plan for discharge tomorrow, Breastfeeding and Contraception depo provera prior to d/c home Continue routine PP care   LOS: 1 day   Andrea Wolfe Y 01/21/2013, 11:05 AM

## 2013-01-21 NOTE — Progress Notes (Signed)
UR chart review completed.  

## 2013-01-22 ENCOUNTER — Ambulatory Visit: Payer: Self-pay

## 2013-01-22 MED ORDER — MEDROXYPROGESTERONE ACETATE 150 MG/ML IM SUSP: 150.0000 mg | Freq: Once | INTRAMUSCULAR | Status: AC

## 2013-01-22 MED ORDER — IBUPROFEN 600 MG PO TABS: 600.0000 mg | ORAL_TABLET | Freq: Four times a day (QID) | ORAL | Status: DC

## 2013-01-22 NOTE — Clinical Social Work Maternal (Signed)
Clinical Social Work Department  PSYCHOSOCIAL ASSESSMENT - MATERNAL/CHILD  01/22/2013  Patient: Andrea Wolfe,Andrea Wolfe Account Number: [id number] Admit Date: 01/20/2013  Childs Name:  October Wolfe. Colleran   Clinical Social Worker: Carloyn Lahue, LCSW Date/Time: 01/22/2013 11:50 AM  Date Referred: 01/22/2013  Referral source   CN    Referred reason   Substance Abuse   Other - See comment   Other referral source:  I: FAMILY / HOME ENVIRONMENT  Child's legal guardian: PARENT  Guardian - Name  Guardian - Age  Guardian - Address   Lashawnna Kropp  31  [address] , [place] [address]   Antonio L. Wilson  32    Other household support members/support persons  Name  Relationship  DOB    DAUGHTER  13 years old    SON  8 years old   Other support:  Family   II PSYCHOSOCIAL DATA  Information Source: Patient Interview  Financial and Community Resources  Employment:  Financial resources: Medicaid  If Medicaid - County: [place]  Other   WIC   Food Stamps   School / Grade:  Maternity Care Coordinator / Child Services Coordination / Early Interventions: Cultural issues impacting care:  III STRENGTHS  Strengths   Adequate Resources   Home prepared for Child (including basic supplies)   Supportive family/friends   Strength comment:  IV RISK FACTORS AND CURRENT PROBLEMS  Current Problem:  Risk Factor & Current Problem  Patient Issue  Family Issue  Risk Factor / Current Problem Comment   Other - See comment  Y  N  NPNC   Substance Abuse  Y  N  Hx of MJ, etoh & cocaine   V SOCIAL WORK ASSESSMENT  CSW met with pt to assess reason for NPNC & substance use history. Pt didn't learn about pregnancy until she was 3 months. She planned to terminate the pregnancy but had to wait until she gathered the money for the procedure. Pt told CSW that she had the money saved for the procedure but her boyfriends mother used some to pay bills. Pt was 6 months, at that time & told CSW that was the latest she could  have procedure performed. Pt states she was upset & cried about having another child but her family/friends were supportive. She never considered adoption. She committed to parenting this child & appears to be bonding well. CSW inquired about pt's substance use history. Pt admits to smoking MJ "not a lot" until she was 6t months pregnant. She also admits to bring liquor heavily until 6 months. When asked about the cocaine, pt said " I was doing it hard, on & off," on the weekends. The is not interested substance abuse treatment. CSW informed pt about positive cocaine results for the infant & advised her that a CPS report would be made. Pt appears to be understanding & denies previous involvement with the agency. Pt has all the necessary supplies for the infant & identified her family/friends as her support system. CPS report was made & accepted as a "72 hour," which means the agency has 3 days to initiate the case. No barriers to discharge at this time.   VI SOCIAL WORK PLAN  Social Work Plan   No Further Intervention Required / No Barriers to Discharge   Type of pt/family education:  If child protective services report - county: [place]  If child protective services report - date: 01/22/2013  Information/referral to community resources comment:  Other social work plan:       date:  01/22/2013 Information/referral to community resources comment:   Other social work plan:

## 2013-01-22 NOTE — Lactation Note (Signed)
This note was copied from the chart of Andrea Veneda Melter. Lactation Consultation Note  Patient Name: Andrea Wolfe [id number] Date: 01/22/2013 Reason for consult: Follow-up assessment Mother states baby is breastfeeding well, her milk is coming in, and she plans to do "both" , breast feed and formula once at home. RN reports mother's milk is coming in and baby has been latching well. Infant has a pediatrician appointment scheduled for Monday, 01/25/13. Mother denies any questions or concerns. Due to infants weight and being 2.5 weeks early, mother was advised to feed frequently with cues and supplement with formula if her milk is delayed. To call [facility] office if needs additional assist. Family ready for discharge.  Maternal Data    Feeding Length of feed: 10 min  LATCH Score/Interventions                      Lactation Tools Discussed/Used     Consult Status      Christella Hartigan M 01/22/2013, 1:48 PM

## 2013-01-22 NOTE — Discharge Summary (Signed)
Obstetric Discharge Summary Reason for Admission: onset of labor Prenatal Procedures: ultrasound Intrapartum Procedures: spontaneous vaginal delivery Postpartum Procedures: none Complications-Operative and Postpartum: none Hemoglobin  Date Value Range Status  01/21/2013 11.3* 12.0 - 15.0 g/dL Final     HCT  Date Value Range Status  01/21/2013 32.6* 36.0 - 46.0 % Final    Physical Exam:  General: alert and no distress Lochia: appropriate Uterine Fundus: firm Incision: n/a DVT Evaluation: No evidence of DVT seen on physical exam. Negative Homan's sign. No significant calf/ankle edema.  Discharge Diagnoses: Term Pregnancy-delivered  Discharge Information: Date: 01/22/2013 Activity: pelvic rest Diet: routine Medications: PNV and Ibuprofen Condition: stable Instructions: refer to practice specific booklet Discharge to: home Follow-up Information   Follow up with [facility] In 6 weeks.   Specialty:  Obstetrics and Gynecology   Contact information:   [address] [place] [address] [phone]      Newborn Data: Live born female  Birth Weight: 5 lb 7.1 oz (2470 g) APGAR: 9, 9  Home with mother. *of note infant tested +cocaine, report to CPS was made via social work  Guage Efferson M 01/22/2013, 9:29 AM

## 2013-04-12 ENCOUNTER — Emergency Department (HOSPITAL_COMMUNITY)
Admission: EM | Admit: 2013-04-12 | Discharge: 2013-04-12 | Disposition: A | Discharge status: Home / Self Care | Payer: Medicaid Other | Attending: Emergency Medicine | Admitting: Emergency Medicine

## 2013-04-12 ENCOUNTER — Encounter (HOSPITAL_COMMUNITY): Payer: Self-pay | Admitting: Emergency Medicine

## 2013-04-12 DIAGNOSIS — M79609 Pain in unspecified limb: Secondary | ICD-10-CM | POA: Insufficient documentation

## 2013-04-12 DIAGNOSIS — F17200 Nicotine dependence, unspecified, uncomplicated: Secondary | ICD-10-CM | POA: Insufficient documentation

## 2013-04-12 DIAGNOSIS — M654 Radial styloid tenosynovitis [de Quervain]: Secondary | ICD-10-CM

## 2013-04-12 DIAGNOSIS — Z791 Long term (current) use of non-steroidal anti-inflammatories (NSAID): Secondary | ICD-10-CM | POA: Insufficient documentation

## 2013-04-12 DIAGNOSIS — Z79899 Other long term (current) drug therapy: Secondary | ICD-10-CM | POA: Insufficient documentation

## 2013-04-12 MED ORDER — NAPROXEN 500 MG PO TABS: 500.0000 mg | ORAL_TABLET | Freq: Two times a day (BID) | ORAL | Status: DC

## 2013-04-12 NOTE — ED Provider Notes (Signed)
CSN: [id number]     Arrival date & time 04/12/13  1023 History   First MD Initiated Contact with Patient 04/12/13 1025    This chart was scribed for Andrea Wolfe, a non-physician practitioner working with Leonette Most B. Bernette Mayers, MD by Lewanda Rife, ED Scribe. This patient was seen in room TR05C/TR05C and the patient's care was started at 10:55 AM     Chief Complaint  Patient presents with  . Wrist Pain  . thumb pain    (Consider location/radiation/quality/duration/timing/severity/associated sxs/prior Treatment) The history is provided by the patient. No language interpreter was used.   HPI Comments: Andrea Wolfe is a 32 y.o. female who presents to the Emergency Department complaining of constant moderate right wrist pain onset 1.5 months. Reports associated unchanged right wrist swelling. Reports pain is exacerbated by touch and movement. Denies trying any treatments at home. Denies associated fever, injury, fall, numbness, and weakness. She has a referral from her ob/gyn to se someone for her hand pain.  History reviewed. No pertinent past medical history. Past Surgical History  Procedure Laterality Date  . Foot surgery     Family History  Problem Relation Age of Onset  . Hypertension Mother   . Diabetes Mother    History  Substance Use Topics  . Smoking status: Current Some Day Smoker -- 0.50 packs/day    Types: Cigarettes  . Smokeless tobacco: Not on file  . Alcohol Use: No   OB History   Grav Para Term Preterm Abortions TAB SAB Ect Mult Living   5 3 3  1 1    3      Review of Systems A complete 10 system review of systems was obtained and all systems are negative except as noted in the HPI and PMHx.    Allergies  Review of patient's allergies indicates no known allergies.  Home Medications   Current Outpatient Rx  Name  Route  Sig  Dispense  Refill  . ibuprofen (ADVIL,MOTRIN) 600 MG tablet   Oral   Take 1 tablet (600 mg total) by mouth every 6 (six)  hours.   30 tablet   0   . naproxen (NAPROSYN) 500 MG tablet   Oral   Take 1 tablet (500 mg total) by mouth 2 (two) times daily.   30 tablet   0   . Prenatal Vit-Fe Fumarate-FA (PRENATAL MULTIVITAMIN) TABS tablet   Oral   Take 1 tablet by mouth daily at 12 noon.          BP 129/66  Pulse 74  Temp(Src) 98.3 F (36.8 C) (Oral)  Resp 18  SpO2 100% Physical Exam  Nursing note and vitals reviewed. Constitutional: She is oriented to person, place, and time. She appears well-developed and well-nourished. No distress.  HENT:  Head: Normocephalic and atraumatic.  Eyes: EOM are normal.  Neck: Neck supple. No tracheal deviation present.  Cardiovascular: Normal rate and intact distal pulses.   Cap refill less < 2 seconds.   Pulmonary/Chest: Effort normal. No respiratory distress.  Musculoskeletal: Normal range of motion.  Positive finkelstein test right side. Tenderness along the first metacarpal of right hand. No warmth over affected area.   Neurological: She is alert and oriented to person, place, and time.  Skin: Skin is warm and dry.  Psychiatric: She has a normal mood and affect. Her behavior is normal.    ED Course  Procedures  COORDINATION OF CARE:  Nursing notes reviewed. Vital signs reviewed. Initial pt interview and examination  performed.   10:55 AM-Discussed treatment plan with pt at bedside. Pt agrees with plan.   Treatment plan initiated:Medications - No data to display   Initial diagnostic testing ordered.    Labs Review Labs Reviewed - No data to display Imaging Review No results found.  EKG Interpretation   None       MDM   1. De Quervain's tenosynovitis, right    Wrist splint, ice, NSAIDs. F/u with ortho if no improvement. Return precautions given. Patient states understanding of treatment care plan and is agreeable.   I personally performed the services described in this documentation, which was scribed in my presence. The recorded  information has been reviewed and is accurate.    Trevor Mace, PA-C 04/12/13 1056

## 2013-04-12 NOTE — ED Provider Notes (Signed)
Medical screening examination/treatment/procedure(s) were performed by non-physician practitioner and as supervising physician I was immediately available for consultation/collaboration.  EKG Interpretation   None         Charles B. Sheldon, MD 04/12/13 1528 

## 2013-04-12 NOTE — ED Notes (Signed)
Patient states she woke up one day about 1 and 1/2 months ago with R wrist pain and R thumb pain.   Patient states she is "supposed to go see a specialist but it will be next month".

## 2013-10-23 IMAGING — US US OB COMP LESS 14 WK
1 series · 14 of 28 positions shown · non-contrast
Comparison: None.

CLINICAL DATA: Bleeding, early pregnancy.  Estimated gestational
age by last menstrual period equals 7 weeks 6 days.

OBSTETRIC <14 WK ULTRASOUND
TECHNIQUE: Transabdominal ultrasound was performed for evaluation
of the gestation as well as the maternal uterus and adnexal
regions.

[Series 1: us ob transvaginal · 32 acquisitions, 14 frames shown]
[im 2/32]
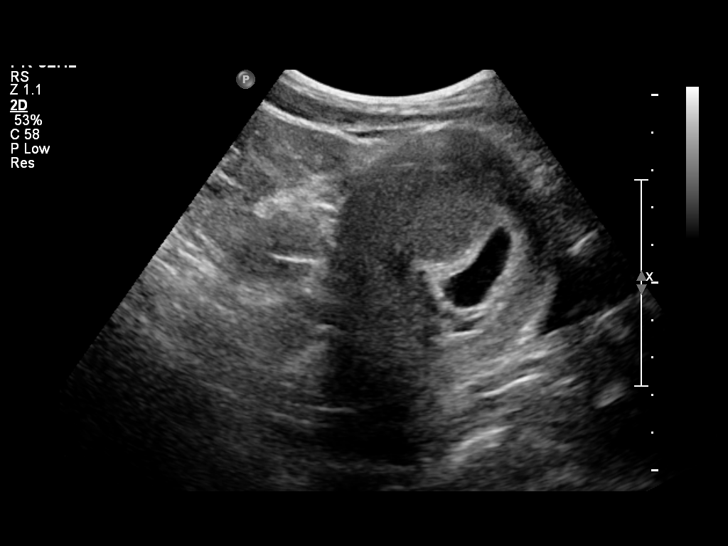
[im 4/32]
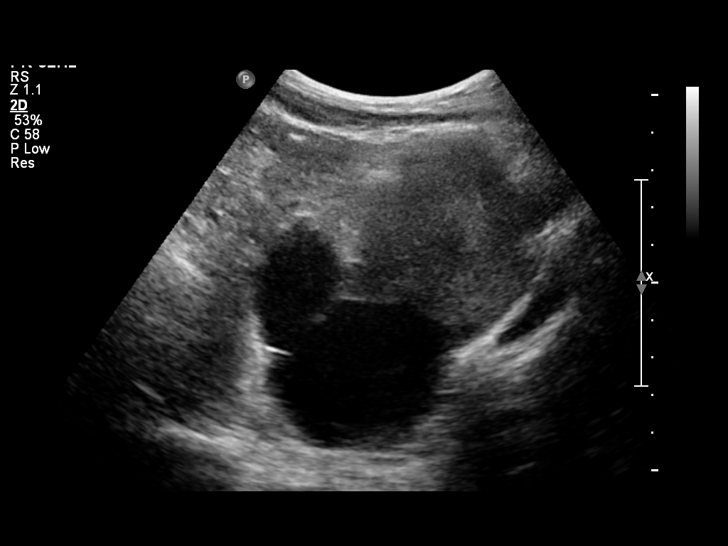
[im 6/32]
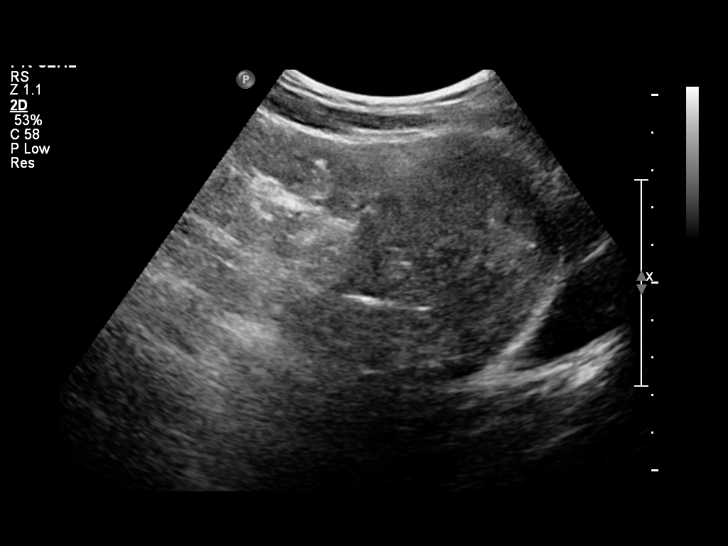
[im 9/32]
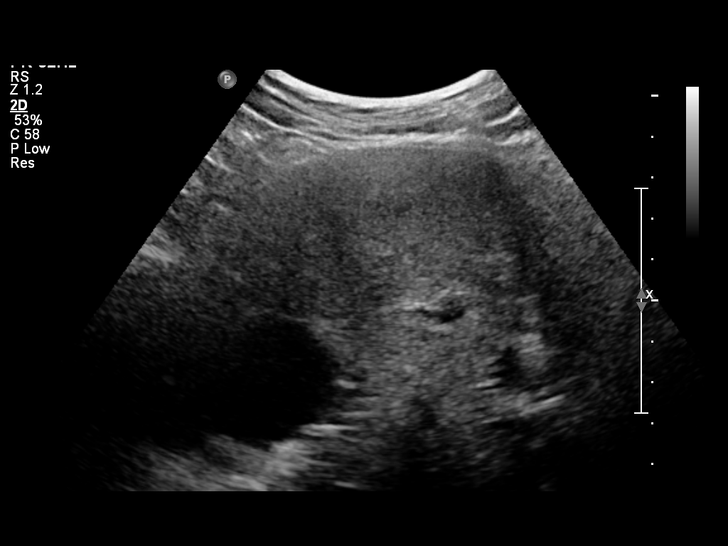
[im 11/32]
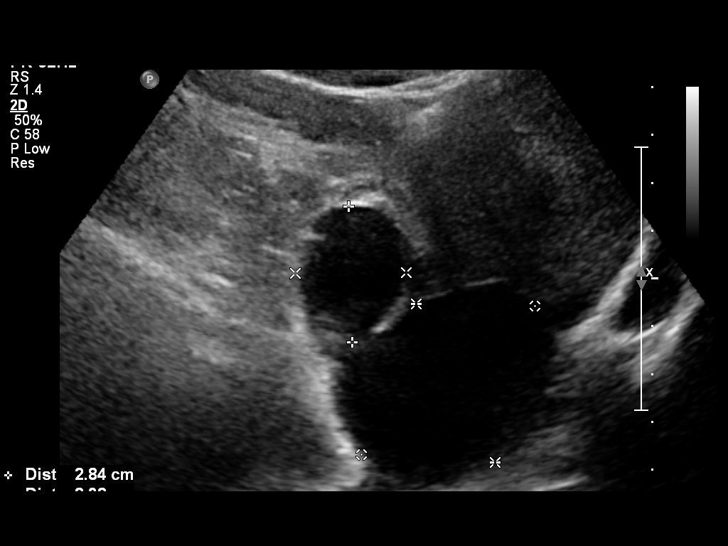
[im 13/32]
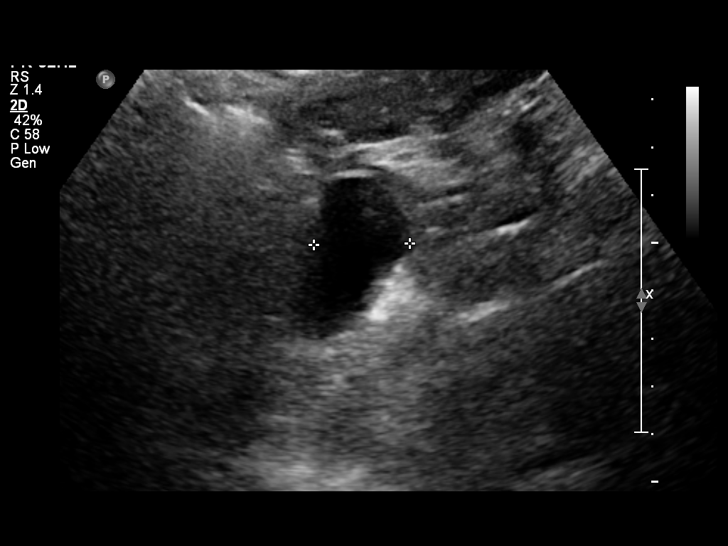
[im 15/32]
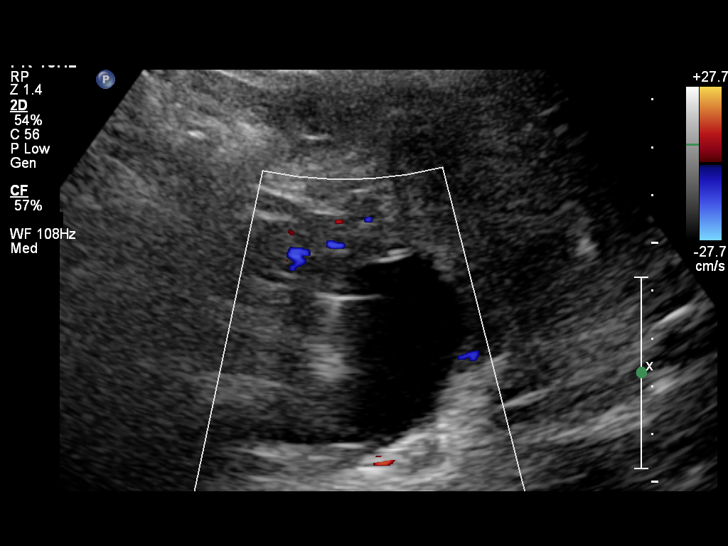
[im 18/32]
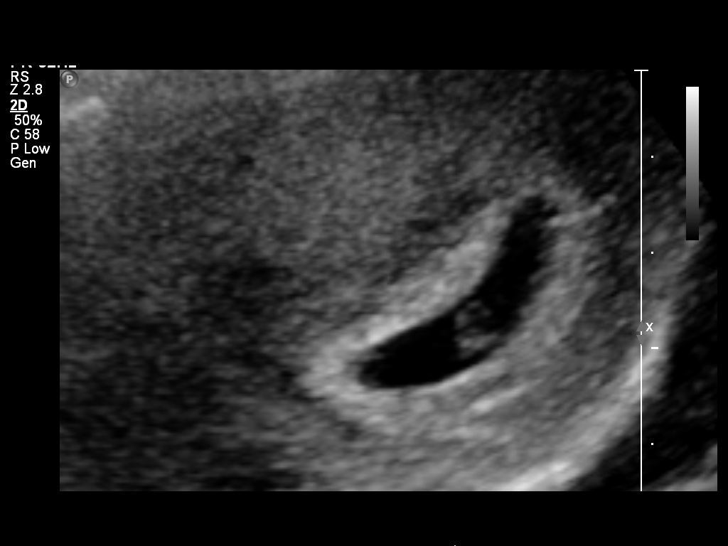
[im 20/32]
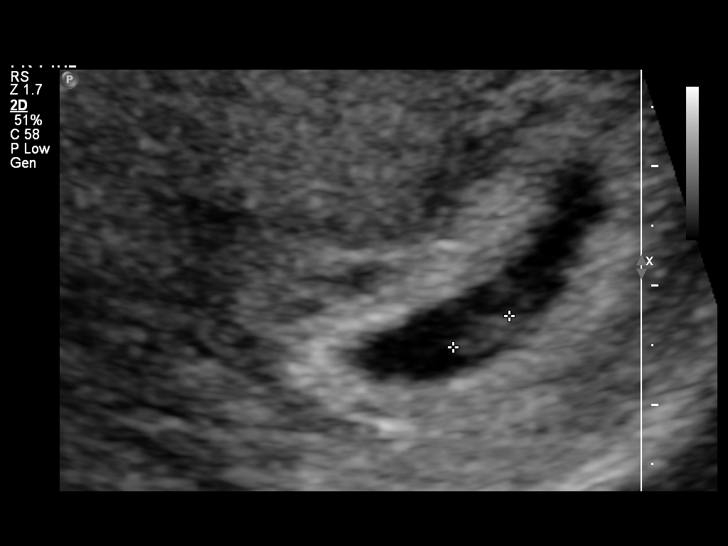
[im 22/32]
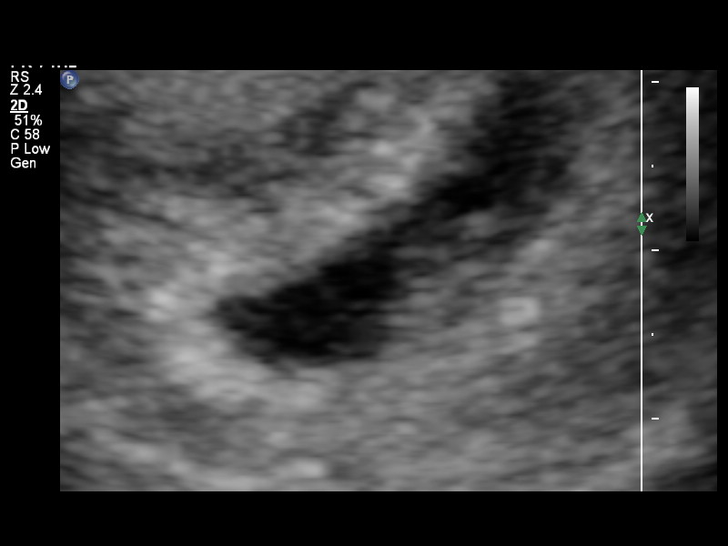
[im 25/32]
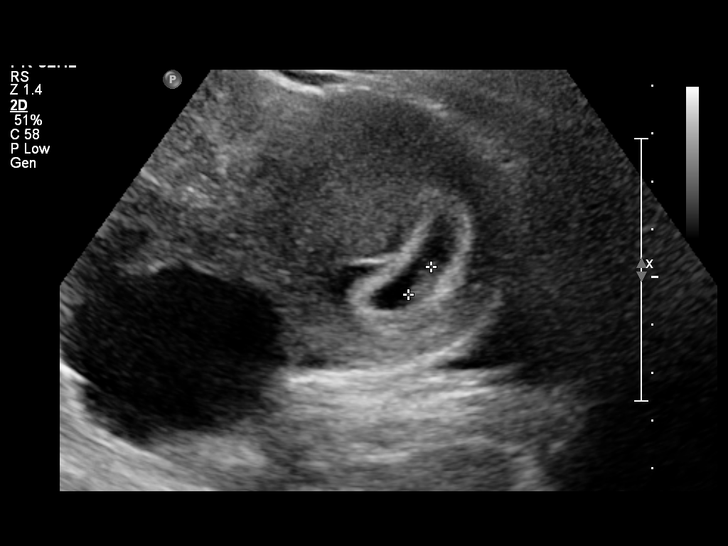
[im 27/32]
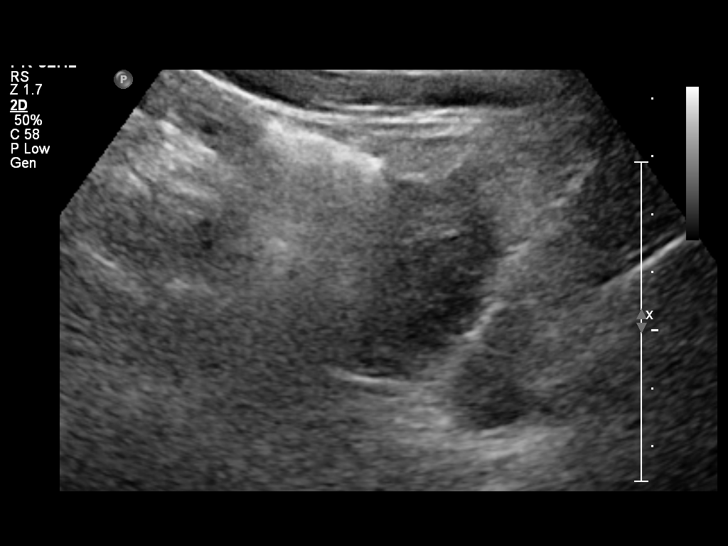
[im 29/32]
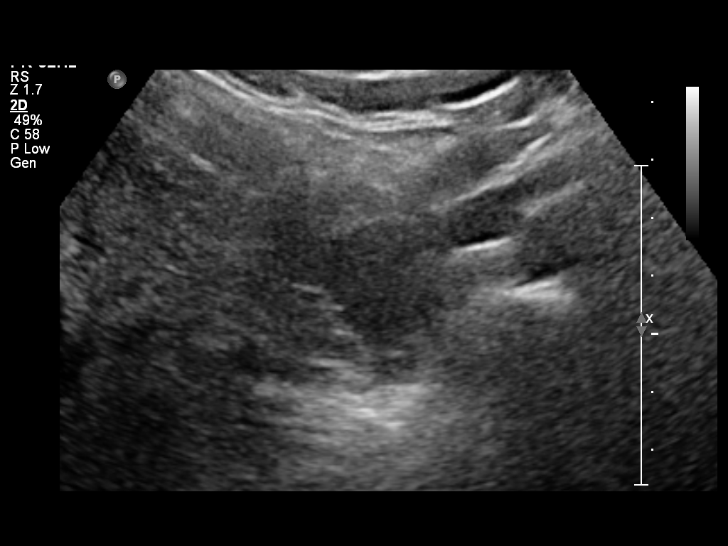
[im 32/32]
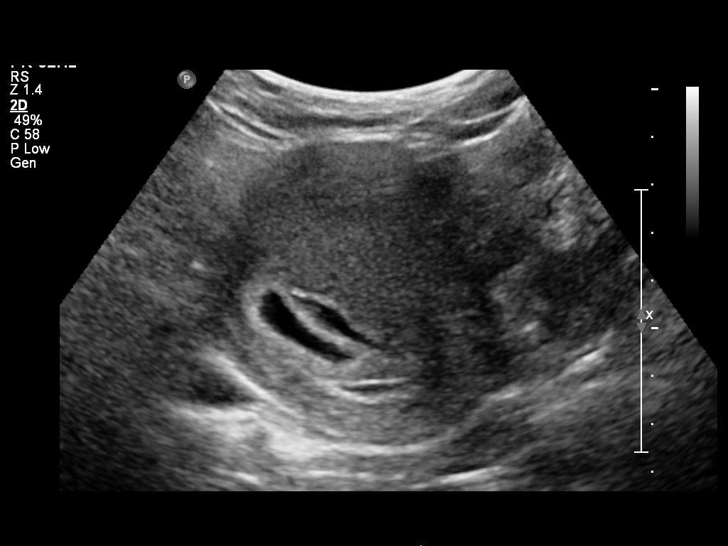

[14 of 28 positions shown; findings below may reference images not displayed]

Intrauterine gestational sac: Present and single
Yolk sac: Present
Embryo: Present
Cardiac Activity: Present
Heart Rate: 128 bpm

CRL:  6.4 mm  six w  4 d            US EDC: 02/06/2013

Maternal uterus/Adnexae:
There are two anechoic cyst associated with the right ovary
measuring 2 cm and 3.4 cm.  No nodularity or blood flow.  Left
ovary is normal.  No free fluid peri
IMPRESSION: 1.  Single intrauterine gestation with normal cardiac activity.
2.  Estimate gestational age by crown-rump length equals 6 weeks 4
days.
3.  Functional ovarian cysts of the right ovary.

## 2014-02-14 ENCOUNTER — Encounter (HOSPITAL_COMMUNITY): Payer: Self-pay | Admitting: Emergency Medicine

## 2014-07-12 ENCOUNTER — Inpatient Hospital Stay (HOSPITAL_COMMUNITY)
Admission: AD | Admit: 2014-07-12 | Discharge: 2014-07-12 | Disposition: A | Discharge status: Home / Self Care | Payer: Self-pay | Source: Ambulatory Visit | Attending: Family Medicine | Admitting: Family Medicine

## 2014-07-12 ENCOUNTER — Encounter (HOSPITAL_COMMUNITY): Payer: Self-pay | Admitting: *Deleted

## 2014-07-12 DIAGNOSIS — R102 Pelvic and perineal pain: Secondary | ICD-10-CM | POA: Insufficient documentation

## 2014-07-12 DIAGNOSIS — F17210 Nicotine dependence, cigarettes, uncomplicated: Secondary | ICD-10-CM | POA: Insufficient documentation

## 2014-07-12 HISTORY — DX: Other specified health status: Z78.9

## 2014-07-12 LAB — URINALYSIS, ROUTINE W REFLEX MICROSCOPIC
BILIRUBIN URINE: NEGATIVE
Glucose, UA: NEGATIVE mg/dL
Hgb urine dipstick: NEGATIVE
KETONES UR: NEGATIVE mg/dL
LEUKOCYTES UA: NEGATIVE
NITRITE: NEGATIVE
Protein, ur: NEGATIVE mg/dL
SPECIFIC GRAVITY, URINE: 1.015 (ref 1.005–1.030)
Urobilinogen, UA: 0.2 mg/dL (ref 0.0–1.0)
pH: 7 (ref 5.0–8.0)

## 2014-07-12 LAB — WET PREP, GENITAL
Clue Cells Wet Prep HPF POC: NONE SEEN
Trich, Wet Prep: NONE SEEN
YEAST WET PREP: NONE SEEN

## 2014-07-12 LAB — POCT PREGNANCY, URINE: PREG TEST UR: NEGATIVE

## 2014-07-12 NOTE — Progress Notes (Signed)
Kerry Hough PA in earlier to discuss test results and d/c plan. Written and verbal d/c instructions given and understanding voiced

## 2014-07-12 NOTE — MAU Provider Note (Signed)
History     CSN: [id number]  Arrival date and time: 07/12/14 1106   First Provider Initiated Contact with Patient 07/12/14 1152      Chief Complaint  Patient presents with  . Possible Pregnancy   HPI Ms. Andrea Wolfe is a 34 y.o. [phone] who presents to MAU today with complaint of lower abdominal cramping and possible pregnancy. The patient states cramping most days x 3 weeks. She states LMP 06/18/14. She has taken Tylenol without relief. She endorses frequent Etoh, marijuana and occasional cocaine use.   OB History    Gravida Para Term Preterm AB TAB SAB Ectopic Multiple Living   4 3 3  1 1    3       Past Medical History  Diagnosis Date  . Medical history non-contributory     Past Surgical History  Procedure Laterality Date  . Foot surgery    . Induced abortion      Family History  Problem Relation Age of Onset  . Hypertension Mother   . Diabetes Mother     History  Substance Use Topics  . Smoking status: Current Some Day Smoker -- 0.50 packs/day    Types: Cigarettes  . Smokeless tobacco: Not on file  . Alcohol Use: Yes    Allergies: No Known Allergies  Prescriptions prior to admission  Medication Sig Dispense Refill Last Dose  . acetaminophen (TYLENOL) 325 MG tablet Take 650 mg by mouth every 6 (six) hours as needed for moderate pain.   Past Month at Unknown time  . diphenhydramine-acetaminophen (TYLENOL PM) 25-500 MG TABS Take 2 tablets by mouth at bedtime as needed (pain).   Past Month at Unknown time  . ibuprofen (ADVIL,MOTRIN) 600 MG tablet Take 1 tablet (600 mg total) by mouth every 6 (six) hours. (Patient not taking: Reported on 07/12/2014) 30 tablet 0   . naproxen (NAPROSYN) 500 MG tablet Take 1 tablet (500 mg total) by mouth 2 (two) times daily. (Patient not taking: Reported on 07/12/2014) 30 tablet 0     Review of Systems  Constitutional: Negative for fever and malaise/fatigue.  Gastrointestinal: Positive for abdominal pain. Negative for nausea,  vomiting, diarrhea and constipation.  Genitourinary: Negative for dysuria, urgency and frequency.       Neg - vaginal bleeding, discharge   Physical Exam   Blood pressure 119/62, pulse 73, temperature 97.9 F (36.6 C), temperature source Oral, resp. rate 18, height 5\' 7"  (1.702 m), weight 163 lb 6 oz (74.106 kg), last menstrual period 06/18/2014, SpO2 100 %.  Physical Exam  Constitutional: She is oriented to person, place, and time. She appears well-developed and well-nourished. No distress.  HENT:  Head: Normocephalic.  Cardiovascular: Normal rate.   Respiratory: Effort normal.  GI: Soft. She exhibits no distension and no mass. There is no tenderness. There is no rebound and no guarding.  Genitourinary: Uterus is not enlarged and not tender. Cervix exhibits no motion tenderness, no discharge and no friability. Right adnexum displays no mass and no tenderness. Left adnexum displays no mass and no tenderness. No bleeding in the vagina. Vaginal discharge (scant, thin, white discharge noted) found.  Neurological: She is alert and oriented to person, place, and time.  Skin: Skin is warm and dry. No erythema.  Psychiatric: She has a normal mood and affect.   Results for orders placed or performed during the hospital encounter of 07/12/14 (from the past 24 hour(s))  Urinalysis, Routine w reflex microscopic     Status: None  Collection Time: 07/12/14 11:23 AM  Result Value Ref Range   Color, Urine YELLOW YELLOW   APPearance CLEAR CLEAR   Specific Gravity, Urine 1.015 1.005 - 1.030   pH 7.0 5.0 - 8.0   Glucose, UA NEGATIVE NEGATIVE mg/dL   Hgb urine dipstick NEGATIVE NEGATIVE   Bilirubin Urine NEGATIVE NEGATIVE   Ketones, ur NEGATIVE NEGATIVE mg/dL   Protein, ur NEGATIVE NEGATIVE mg/dL   Urobilinogen, UA 0.2 0.0 - 1.0 mg/dL   Nitrite NEGATIVE NEGATIVE   Leukocytes, UA NEGATIVE NEGATIVE  Pregnancy, urine POC     Status: None   Collection Time: 07/12/14 11:32 AM  Result Value Ref Range    Preg Test, Ur NEGATIVE NEGATIVE  Wet prep, genital     Status: Abnormal   Collection Time: 07/12/14 12:23 PM  Result Value Ref Range   Yeast Wet Prep HPF POC NONE SEEN NONE SEEN   Trich, Wet Prep NONE SEEN NONE SEEN   Clue Cells Wet Prep HPF POC NONE SEEN NONE SEEN   WBC, Wet Prep HPF POC FEW (A) NONE SEEN    MAU Course  Procedures None  MDM UPT - negative UA, wet prep and GC/Chlamydia today Patient declines HIV testing  Assessment and Plan  A: Pelvic pain  P: Discharge home Patient advised to take Ibuprofen and Tylenol PRN for pain Patient may return to MAU as needed or if her condition were to change or worsen   Luvenia Redden, PA-C  07/12/2014, 12:52 PM

## 2014-07-12 NOTE — MAU Note (Signed)
Feels could be preg.  Had sex 02/29, menstruation came on 03/05.   Normal period.  Shortly after period ended,started feeling sick on her stomach.

## 2014-07-12 NOTE — H&P (Signed)
History     CSN: [id number]  Arrival date and time: 07/12/14 1106  Abdominal pain, cramping    Chief Complaint  Patient presents with  . Possible Pregnancy   HPI Pt reports generalized cramp in the lower abdomen, lower back pain and radiate to thighs onset at the beginning of March, avg 3 times a day most painful in the morning.  Pt describes the pain and dull and cramp-like, tend to persist for hours during the day.  Pt took tylenol with no alleviation, and suspects pregnancy.  Pt was concerned because she is a chronic alcohol user with >7 drinks/week.  Pt also smokes marijuana and cigarettes daily and reports occasional cocaine use.  Pt is currently unemployed and reports that she donates plasma two times a week for income.  Pertinent Gynecological History: Menses: flow is moderate and regular every 28 days without intermenstrual spotting Bleeding: intermenstrual bleeding Contraception: condoms DES exposure: denies Blood transfusions: none Sexually transmitted diseases: past history: genital warts Previous GYN Procedures: one elective abortion  Last pap: abnormal: not sure Date: 2 years ago   No past medical history on file.  Past Surgical History  Procedure Laterality Date  . Foot surgery      Family History  Problem Relation Age of Onset  . Hypertension Mother   . Diabetes Mother     History  Substance Use Topics  . Smoking status: Current Some Day Smoker -- 0.50 packs/day    Types: Cigarettes  . Smokeless tobacco: Not on file  . Alcohol Use: No    Allergies: No Known Allergies  Prescriptions prior to admission  Medication Sig Dispense Refill Last Dose  . acetaminophen (TYLENOL) 325 MG tablet Take 650 mg by mouth every 6 (six) hours as needed for moderate pain.   Past Week at Unknown time  . diphenhydramine-acetaminophen (TYLENOL PM) 25-500 MG TABS Take 2 tablets by mouth at bedtime as needed (pain).   Past Week at Unknown time  . ibuprofen (ADVIL,MOTRIN) 600  MG tablet Take 1 tablet (600 mg total) by mouth every 6 (six) hours. (Patient not taking: Reported on 07/12/2014) 30 tablet 0   . naproxen (NAPROSYN) 500 MG tablet Take 1 tablet (500 mg total) by mouth 2 (two) times daily. (Patient not taking: Reported on 07/12/2014) 30 tablet 0     Review of Systems  Constitutional: Positive for weight loss and malaise/fatigue. Negative for fever and chills.  Eyes: Negative for blurred vision.  Respiratory: Positive for cough and sputum production. Negative for hemoptysis, shortness of breath and wheezing.   Cardiovascular: Negative for chest pain and palpitations.  Gastrointestinal: Positive for nausea and abdominal pain. Negative for heartburn, vomiting, diarrhea, constipation and blood in stool.  Musculoskeletal: Positive for back pain. Negative for falls.  Neurological: Negative for headaches.   Physical Exam   Blood pressure 119/62, pulse 73, temperature 97.9 F (36.6 C), temperature source Oral, resp. rate 18, height 5\' 7"  (1.702 m), weight 74.106 kg (163 lb 6 oz), last menstrual period 06/18/2014, SpO2 100 %.  Physical Exam  Constitutional: She is oriented to person, place, and time. She appears well-developed and well-nourished.  HENT:  Head: Normocephalic and atraumatic.  Cardiovascular: Normal rate, regular rhythm and normal heart sounds.  Exam reveals no gallop and no friction rub.   No murmur heard. Respiratory: Effort normal and breath sounds normal. No respiratory distress. She has no wheezes.  GI: Soft. Bowel sounds are normal. She exhibits no distension. There is hepatomegaly. There is no tenderness.  There is no rebound, no guarding and no CVA tenderness.  Genitourinary: Vagina normal and uterus normal. No erythema, tenderness or bleeding in the vagina. No vaginal discharge found.  Musculoskeletal: She exhibits no edema.  Neurological: She is alert and oriented to person, place, and time.  Skin: Skin is warm.    MAU Course   Procedures  MDM  Results for orders placed or performed during the hospital encounter of 07/12/14 (from the past 24 hour(s))  Urinalysis, Routine w reflex microscopic     Status: None   Collection Time: 07/12/14 11:23 AM  Result Value Ref Range   Color, Urine YELLOW YELLOW   APPearance CLEAR CLEAR   Specific Gravity, Urine 1.015 1.005 - 1.030   pH 7.0 5.0 - 8.0   Glucose, UA NEGATIVE NEGATIVE mg/dL   Hgb urine dipstick NEGATIVE NEGATIVE   Bilirubin Urine NEGATIVE NEGATIVE   Ketones, ur NEGATIVE NEGATIVE mg/dL   Protein, ur NEGATIVE NEGATIVE mg/dL   Urobilinogen, UA 0.2 0.0 - 1.0 mg/dL   Nitrite NEGATIVE NEGATIVE   Leukocytes, UA NEGATIVE NEGATIVE  Pregnancy, urine POC     Status: None   Collection Time: 07/12/14 11:32 AM  Result Value Ref Range   Preg Test, Ur NEGATIVE NEGATIVE   Assessment and Plan  Pregnancy test was negative, pt is not pregnant Normal UA rules out UTI Speculum exam with wet prep and GC Chlamydia samples to rule out possible vaginal infections Pt was advised to reduce drinking/smokin/drug use   Lissa Hoard 07/12/2014, 11:39 AM

## 2014-07-12 NOTE — Discharge Instructions (Signed)
Abdominal Pain, Women °Abdominal (stomach, pelvic, or belly) pain can be caused by many things. It is important to tell your doctor: °· The location of the pain. °· Does it come and go or is it present all the time? °· Are there things that start the pain (eating certain foods, exercise)? °· Are there other symptoms associated with the pain (fever, nausea, vomiting, diarrhea)? °All of this is helpful to know when trying to find the cause of the pain. °CAUSES  °· Stomach: virus or bacteria infection, or ulcer. °· Intestine: appendicitis (inflamed appendix), regional ileitis (Crohn's disease), ulcerative colitis (inflamed colon), irritable bowel syndrome, diverticulitis (inflamed diverticulum of the colon), or cancer of the stomach or intestine. °· Gallbladder disease or stones in the gallbladder. °· Kidney disease, kidney stones, or infection. °· Pancreas infection or cancer. °· Fibromyalgia (pain disorder). °· Diseases of the female organs: °¨ Uterus: fibroid (non-cancerous) tumors or infection. °¨ Fallopian tubes: infection or tubal pregnancy. °¨ Ovary: cysts or tumors. °¨ Pelvic adhesions (scar tissue). °¨ Endometriosis (uterus lining tissue growing in the pelvis and on the pelvic organs). °¨ Pelvic congestion syndrome (female organs filling up with blood just before the menstrual period). °¨ Pain with the menstrual period. °¨ Pain with ovulation (producing an egg). °¨ Pain with an IUD (intrauterine device, birth control) in the uterus. °¨ Cancer of the female organs. °· Functional pain (pain not caused by a disease, may improve without treatment). °· Psychological pain. °· Depression. °DIAGNOSIS  °Your doctor will decide the seriousness of your pain by doing an examination. °· Blood tests. °· X-rays. °· Ultrasound. °· CT scan (computed tomography, special type of X-ray). °· MRI (magnetic resonance imaging). °· Cultures, for infection. °· Barium enema (dye inserted in the large intestine, to better view it with  X-rays). °· Colonoscopy (looking in intestine with a lighted tube). °· Laparoscopy (minor surgery, looking in abdomen with a lighted tube). °· Major abdominal exploratory surgery (looking in abdomen with a large incision). °TREATMENT  °The treatment will depend on the cause of the pain.  °· Many cases can be observed and treated at home. °· Over-the-counter medicines recommended by your caregiver. °· Prescription medicine. °· Antibiotics, for infection. °· Birth control pills, for painful periods or for ovulation pain. °· Hormone treatment, for endometriosis. °· Nerve blocking injections. °· Physical therapy. °· Antidepressants. °· Counseling with a psychologist or psychiatrist. °· Minor or major surgery. °HOME CARE INSTRUCTIONS  °· Do not take laxatives, unless directed by your caregiver. °· Take over-the-counter pain medicine only if ordered by your caregiver. Do not take aspirin because it can cause an upset stomach or bleeding. °· Try a clear liquid diet (broth or water) as ordered by your caregiver. Slowly move to a bland diet, as tolerated, if the pain is related to the stomach or intestine. °· Have a thermometer and take your temperature several times a day, and record it. °· Bed rest and sleep, if it helps the pain. °· Avoid sexual intercourse, if it causes pain. °· Avoid stressful situations. °· Keep your follow-up appointments and tests, as your caregiver orders. °· If the pain does not go away with medicine or surgery, you may try: °¨ Acupuncture. °¨ Relaxation exercises (yoga, meditation). °¨ Group therapy. °¨ Counseling. °SEEK MEDICAL CARE IF:  °· You notice certain foods cause stomach pain. °· Your home care treatment is not helping your pain. °· You need stronger pain medicine. °· You want your IUD removed. °· You feel faint or   lightheaded. °· You develop nausea and vomiting. °· You develop a rash. °· You are having side effects or an allergy to your medicine. °SEEK IMMEDIATE MEDICAL CARE IF:  °· Your  pain does not go away or gets worse. °· You have a fever. °· Your pain is felt only in portions of the abdomen. The right side could possibly be appendicitis. The left lower portion of the abdomen could be colitis or diverticulitis. °· You are passing blood in your stools (bright red or black tarry stools, with or without vomiting). °· You have blood in your urine. °· You develop chills, with or without a fever. °· You pass out. °MAKE SURE YOU:  °· Understand these instructions. °· Will watch your condition. °· Will get help right away if you are not doing well or get worse. °Document Released: 01/27/2007 Document Revised: 08/16/2013 Document Reviewed: 02/16/2009 °[employer]® Patient Information ©2015 [employer]. This information is not intended to replace advice given to you by your health care provider. Make sure you discuss any questions you have with your health care provider. ° °

## 2014-07-13 LAB — GC/CHLAMYDIA PROBE AMP (~~LOC~~) NOT AT ARMC
CHLAMYDIA, DNA PROBE: NEGATIVE
NEISSERIA GONORRHEA: NEGATIVE

## 2015-11-26 ENCOUNTER — Emergency Department (HOSPITAL_COMMUNITY): Payer: Self-pay

## 2015-11-26 ENCOUNTER — Emergency Department (HOSPITAL_COMMUNITY)
Admission: EM | Admit: 2015-11-26 | Discharge: 2015-11-27 | Disposition: A | Discharge status: Home / Self Care | Payer: Self-pay | Attending: Emergency Medicine | Admitting: Emergency Medicine

## 2015-11-26 ENCOUNTER — Encounter (HOSPITAL_COMMUNITY): Payer: Self-pay | Admitting: *Deleted

## 2015-11-26 DIAGNOSIS — F17210 Nicotine dependence, cigarettes, uncomplicated: Secondary | ICD-10-CM | POA: Insufficient documentation

## 2015-11-26 DIAGNOSIS — R102 Pelvic and perineal pain: Secondary | ICD-10-CM | POA: Insufficient documentation

## 2015-11-26 LAB — COMPREHENSIVE METABOLIC PANEL
ALBUMIN: 3.3 g/dL — AB (ref 3.5–5.0)
ALK PHOS: 37 U/L — AB (ref 38–126)
ALT: 15 U/L (ref 14–54)
AST: 24 U/L (ref 15–41)
Anion gap: 3 — ABNORMAL LOW (ref 5–15)
BUN: 13 mg/dL (ref 6–20)
CHLORIDE: 101 mmol/L (ref 101–111)
CO2: 27 mmol/L (ref 22–32)
Calcium: 8.4 mg/dL — ABNORMAL LOW (ref 8.9–10.3)
Creatinine, Ser: 1.22 mg/dL — ABNORMAL HIGH (ref 0.44–1.00)
GFR calc non Af Amer: 57 mL/min — ABNORMAL LOW (ref >60)
Glucose, Bld: 93 mg/dL (ref 65–99)
POTASSIUM: 3.6 mmol/L (ref 3.5–5.1)
SODIUM: 131 mmol/L — AB (ref 135–145)
Total Bilirubin: 0.4 mg/dL (ref 0.3–1.2)
Total Protein: 5.9 g/dL — ABNORMAL LOW (ref 6.5–8.1)

## 2015-11-26 LAB — CBC
HEMATOCRIT: 36.7 % (ref 36.0–46.0)
Hemoglobin: 12.2 g/dL (ref 12.0–15.0)
MCH: 32.2 pg (ref 26.0–34.0)
MCHC: 33.2 g/dL (ref 30.0–36.0)
MCV: 96.8 fL (ref 78.0–100.0)
Platelets: 280 10*3/uL (ref 150–400)
RBC: 3.79 MIL/uL — AB (ref 3.87–5.11)
RDW: 12.2 % (ref 11.5–15.5)
WBC: 11.5 10*3/uL — AB (ref 4.0–10.5)

## 2015-11-26 LAB — URINALYSIS, ROUTINE W REFLEX MICROSCOPIC
Bilirubin Urine: NEGATIVE
Glucose, UA: NEGATIVE mg/dL
Hgb urine dipstick: NEGATIVE
Ketones, ur: NEGATIVE mg/dL
LEUKOCYTES UA: NEGATIVE
Nitrite: NEGATIVE
PH: 7.5 (ref 5.0–8.0)
Protein, ur: NEGATIVE mg/dL
Specific Gravity, Urine: 1.025 (ref 1.005–1.030)

## 2015-11-26 LAB — LIPASE, BLOOD: LIPASE: 30 U/L (ref 11–51)

## 2015-11-26 LAB — WET PREP, GENITAL
Sperm: NONE SEEN
TRICH WET PREP: NONE SEEN
Yeast Wet Prep HPF POC: NONE SEEN

## 2015-11-26 LAB — POC URINE PREG, ED: Preg Test, Ur: NEGATIVE

## 2015-11-26 MED ORDER — SODIUM CHLORIDE 0.9 % IV BOLUS (SEPSIS)
1000.0000 mL | Freq: Once | INTRAVENOUS | Status: AC
Start: 2015-11-26 — End: 2015-11-27
  Administered 2015-11-26: 1000 mL via INTRAVENOUS

## 2015-11-26 MED ORDER — KETOROLAC TROMETHAMINE 30 MG/ML IJ SOLN
30.0000 mg | Freq: Once | INTRAMUSCULAR | Status: AC
  Administered 2015-11-26: 30 mg via INTRAVENOUS
  Filled 2015-11-26: qty 1

## 2015-11-26 NOTE — ED Provider Notes (Signed)
[facility] DEPT Provider Note   CSN: [id number] Arrival date & time: 11/26/15  2220  First Provider Contact:  None       History   Chief Complaint Chief Complaint  Patient presents with  . Abdominal Pain    HPI Andrea Wolfe is a 35 y.o. female.  35 year old female percent to the emergency department for evaluation of suprapubic abdominal pain which began sudden in onset this morning. Patient states that pain has been constant and is sharp in nature. She states that it is worse when "driving over the bumps to the hospital" as well as with coughing or blowing her nose. She denies taking any medications for her symptoms and has not noted any radiation of her pain. Patient denies any fever, nausea, vomiting, dysuria, hematuria, vaginal bleeding or discharge, or history of abdominal surgeries. Patient further denies melena and hematochezia. She has had a normal bowel movement in the last 24 hours. LMP 11/14/15.   The history is provided by the patient. No language interpreter was used.    Past Medical History:  Diagnosis Date  . Medical history non-contributory     There are no active problems to display for this patient.   Past Surgical History:  Procedure Laterality Date  . FOOT SURGERY    . INDUCED ABORTION      OB History    Gravida Para Term Preterm AB Living   4 3 3   1 3    SAB TAB Ectopic Multiple Live Births     1     1       Home Medications    Prior to Admission medications   Medication Sig Start Date End Date Taking? Authorizing Provider  acetaminophen (TYLENOL) 325 MG tablet Take 650 mg by mouth every 6 (six) hours as needed for moderate pain.    Historical Provider, MD  diphenhydramine-acetaminophen (TYLENOL PM) 25-500 MG TABS Take 2 tablets by mouth at bedtime as needed (pain).    Historical Provider, MD  ibuprofen (ADVIL,MOTRIN) 600 MG tablet Take 1 tablet (600 mg total) by mouth every 6 (six) hours. Patient not taking: Reported on 07/12/2014  01/22/13   Arville Go, CNM  naproxen (NAPROSYN) 500 MG tablet Take 1 tablet (500 mg total) by mouth 2 (two) times daily. 11/27/15   Antonietta Breach, PA-C    Family History Family History  Problem Relation Age of Onset  . Hypertension Mother   . Diabetes Mother     Social History Social History  Substance Use Topics  . Smoking status: Current Some Day Smoker    Packs/day: 0.50    Types: Cigarettes  . Smokeless tobacco: Never Used  . Alcohol use Yes     Allergies   Review of patient's allergies indicates no known allergies.   Review of Systems Review of Systems  Gastrointestinal: Positive for abdominal pain.  Ten systems reviewed and are negative for acute change, except as noted in the HPI.    Physical Exam Updated Vital Signs BP (!) 105/53   Pulse 84   Temp 98.4 F (36.9 C) (Oral)   Resp 18   Ht 5\' 6"  (1.676 m)   Wt 72.2 kg   LMP 11/14/2015   SpO2 100%   BMI 25.70 kg/m   Physical Exam  Constitutional: She is oriented to person, place, and time. She appears well-developed and well-nourished. No distress.  Patient nontoxic appearing, in no visible or audible discomfort.  HENT:  Head: Normocephalic and atraumatic.  Eyes: Conjunctivae and  EOM are normal. No scleral icterus.  Neck: Normal range of motion.  Cardiovascular: Normal rate, regular rhythm and intact distal pulses.   Pulmonary/Chest: Effort normal. No respiratory distress.  Respirations even and unlabored  Abdominal: Soft. Normal appearance and bowel sounds are normal. She exhibits no distension and no mass. There is tenderness in the suprapubic area.    Focal tenderness in the suprapubic abdomen. No tenderness at McBurney's point. No masses. No peritoneal signs. Bowel sounds normoactive.  Genitourinary: There is no rash, tenderness or lesion on the right labia. There is no rash, tenderness or lesion on the left labia. Uterus is tender. Cervix exhibits no motion tenderness. Right adnexum displays no  tenderness. Left adnexum displays no tenderness. No bleeding in the vagina. No foreign body in the vagina. No signs of injury around the vagina.  Genitourinary Comments: Mild uterine TTP. No CMT. No distinct adnexal TTP or masses palpable.  Musculoskeletal: Normal range of motion.  Neurological: She is alert and oriented to person, place, and time.  Skin: Skin is warm and dry. No rash noted. She is not diaphoretic. No erythema. No pallor.  Psychiatric: She has a normal mood and affect. Her behavior is normal.  Nursing note and vitals reviewed.    ED Treatments / Results  Labs (all labs ordered are listed, but only abnormal results are displayed) Labs Reviewed  WET PREP, GENITAL - Abnormal; Notable for the following:       Result Value   Clue Cells Wet Prep HPF POC PRESENT (*)    WBC, Wet Prep HPF POC MODERATE (*)    All other components within normal limits  COMPREHENSIVE METABOLIC PANEL - Abnormal; Notable for the following:    Sodium 131 (*)    Creatinine, Ser 1.22 (*)    Calcium 8.4 (*)    Total Protein 5.9 (*)    Albumin 3.3 (*)    Alkaline Phosphatase 37 (*)    GFR calc non Af Amer 57 (*)    Anion gap 3 (*)    All other components within normal limits  CBC - Abnormal; Notable for the following:    WBC 11.5 (*)    RBC 3.79 (*)    All other components within normal limits  URINALYSIS, ROUTINE W REFLEX MICROSCOPIC (NOT AT [facility]) - Abnormal; Notable for the following:    APPearance CLOUDY (*)    All other components within normal limits  LIPASE, BLOOD  POC URINE PREG, ED  GC/CHLAMYDIA PROBE AMP ([place]) NOT AT [facility]    EKG  EKG Interpretation None       Radiology US Transvaginal Non-ob  Result Date: 11/27/2015 CLINICAL DATA:  Initial evaluation for acute pelvic pain. EXAM: TRANSABDOMINAL AND TRANSVAGINAL ULTRASOUND OF PELVIS DOPPLER ULTRASOUND OF OVARIES TECHNIQUE: Both transabdominal and transvaginal ultrasound examinations of the pelvis were performed.  Transabdominal technique was performed for global imaging of the pelvis including uterus, ovaries, adnexal regions, and pelvic cul-de-sac. It was necessary to proceed with endovaginal exam following the transabdominal exam to visualize the uterus and ovaries. Color and duplex Doppler ultrasound was utilized to evaluate blood flow to the ovaries. COMPARISON:  None. FINDINGS: Uterus Measurements: 8.3 x 4.3 x 5.6 cm. No fibroids or other mass visualized. Endometrium Thickness: 6 mm.  No focal abnormality visualized. Right ovary Measurements: 3.4 x 2.5 x 2.4 cm. Normal appearance/no adnexal mass. No made of a 1.9 x 1.7 x 1.9 cm cyst, most consistent with a normal physiologic cyst. Left ovary Measurements: 2.8 x  2.1 x 2.5 cm. Normal appearance/no adnexal mass. Pulsed Doppler evaluation of both ovaries demonstrates normal low-resistance arterial and venous waveforms. Other findings No abnormal free fluid. IMPRESSION: Negative pelvic ultrasound. No acute abnormality identified. No evidence for ovarian torsion. Electronically Signed   By: Jeannine Boga M.D.   On: 11/27/2015 00:56   US Pelvis Complete  Result Date: 11/27/2015 CLINICAL DATA:  Initial evaluation for acute pelvic pain. EXAM: TRANSABDOMINAL AND TRANSVAGINAL ULTRASOUND OF PELVIS DOPPLER ULTRASOUND OF OVARIES TECHNIQUE: Both transabdominal and transvaginal ultrasound examinations of the pelvis were performed. Transabdominal technique was performed for global imaging of the pelvis including uterus, ovaries, adnexal regions, and pelvic cul-de-sac. It was necessary to proceed with endovaginal exam following the transabdominal exam to visualize the uterus and ovaries. Color and duplex Doppler ultrasound was utilized to evaluate blood flow to the ovaries. COMPARISON:  None. FINDINGS: Uterus Measurements: 8.3 x 4.3 x 5.6 cm. No fibroids or other mass visualized. Endometrium Thickness: 6 mm.  No focal abnormality visualized. Right ovary Measurements: 3.4 x 2.5  x 2.4 cm. Normal appearance/no adnexal mass. No made of a 1.9 x 1.7 x 1.9 cm cyst, most consistent with a normal physiologic cyst. Left ovary Measurements: 2.8 x 2.1 x 2.5 cm. Normal appearance/no adnexal mass. Pulsed Doppler evaluation of both ovaries demonstrates normal low-resistance arterial and venous waveforms. Other findings No abnormal free fluid. IMPRESSION: Negative pelvic ultrasound. No acute abnormality identified. No evidence for ovarian torsion. Electronically Signed   By: Jeannine Boga M.D.   On: 11/27/2015 00:56   [place] Art/ven Flow Abd Pelv Doppler  Result Date: 11/27/2015 CLINICAL DATA:  Initial evaluation for acute pelvic pain. EXAM: TRANSABDOMINAL AND TRANSVAGINAL ULTRASOUND OF PELVIS DOPPLER ULTRASOUND OF OVARIES TECHNIQUE: Both transabdominal and transvaginal ultrasound examinations of the pelvis were performed. Transabdominal technique was performed for global imaging of the pelvis including uterus, ovaries, adnexal regions, and pelvic cul-de-sac. It was necessary to proceed with endovaginal exam following the transabdominal exam to visualize the uterus and ovaries. Color and duplex Doppler ultrasound was utilized to evaluate blood flow to the ovaries. COMPARISON:  None. FINDINGS: Uterus Measurements: 8.3 x 4.3 x 5.6 cm. No fibroids or other mass visualized. Endometrium Thickness: 6 mm.  No focal abnormality visualized. Right ovary Measurements: 3.4 x 2.5 x 2.4 cm. Normal appearance/no adnexal mass. No made of a 1.9 x 1.7 x 1.9 cm cyst, most consistent with a normal physiologic cyst. Left ovary Measurements: 2.8 x 2.1 x 2.5 cm. Normal appearance/no adnexal mass. Pulsed Doppler evaluation of both ovaries demonstrates normal low-resistance arterial and venous waveforms. Other findings No abnormal free fluid. IMPRESSION: Negative pelvic ultrasound. No acute abnormality identified. No evidence for ovarian torsion. Electronically Signed   By: Jeannine Boga M.D.   On: 11/27/2015 00:56     Procedures Procedures (including critical care time)  Medications Ordered in ED Medications  sodium chloride 0.9 % bolus 1,000 mL (0 mLs Intravenous Stopped 11/27/15 0130)  ketorolac (TORADOL) 30 MG/ML injection 30 mg (30 mg Intravenous Given 11/26/15 2359)     Initial Impression / Assessment and Plan / ED Course  I have reviewed the triage vital signs and the nursing notes.  Pertinent labs & imaging results that were available during my care of the patient were reviewed by me and considered in my medical decision making (see chart for details).  Clinical Course    1:34 AM Patient reassessed. Abdominal exam is stable. No peritoneal signs. Pain continues to be focal to the suprapubic abdomen. No  TTP at McBurney's point. Given sudden onset of symptoms without nausea, vomiting, or fever, low suspicion for appendicitis. Creatinine noted to be 1.22. This may be due to dehydration; however kidney stone is not fully excluded. Patient with no hematuria today and location of pain is slightly less consistent with kidney stone. Still, management would include pain control and patient has no evidence of UTI on urinalysis today. The patient has been hydrated with IVF. No concern for ectopic pregnancy given negative pregnancy test.  Ultrasound performed which is negative for TOA or ovarian torsion. No evidence to suggest ruptured ovarian cyst. These results have been discussed with the patient who verbalizes understanding. I have discussed with patient that we are unable to completely exclude the possibility of STDs today. She has been instructed to follow-up with the health department in 2 days regarding the results of these tests.  Given stability of abdominal reexaminations and reassuring vital signs, I do believe further emergent workup is indicated. The patient has been referred to [facility] and/or a primary care doctor for follow-up regarding her symptoms today. Will discharge with  naproxen. The patient has been told to return for any new or concerning symptoms such as if her pain were to significantly worsen or if she were to develop a fever or nausea/vomiting. Return precautions provided at discharge. Patient agreeable to plan with no unaddressed concerns; discharged in satisfactory condition.   Final Clinical Impressions(s) / ED Diagnoses   Final diagnoses:  Pelvic pain in female    New Prescriptions New Prescriptions   NAPROXEN (NAPROSYN) 500 MG TABLET    Take 1 tablet (500 mg total) by mouth 2 (two) times daily.     Antonietta Breach, PA-C 11/27/15 0144    Noemi Chapel, MD 11/29/15 2142

## 2015-11-26 NOTE — ED Triage Notes (Signed)
The pt is c/o abd pain since this am  No n v  Sl diarrhea  lmp  Aug 1st

## 2015-11-27 LAB — GC/CHLAMYDIA PROBE AMP (~~LOC~~) NOT AT ARMC
Chlamydia: NEGATIVE
Neisseria Gonorrhea: NEGATIVE

## 2015-11-27 MED ORDER — NAPROXEN 500 MG PO TABS: 500.0000 mg | ORAL_TABLET | Freq: Two times a day (BID) | ORAL | 0 refills | Status: DC

## 2015-11-27 MED ORDER — OXYCODONE-ACETAMINOPHEN 5-325 MG PO TABS
2.0000 | ORAL_TABLET | Freq: Once | ORAL | Status: AC
  Administered 2015-11-27: 2 via ORAL
  Filled 2015-11-27: qty 2

## 2015-11-27 NOTE — Discharge Instructions (Signed)
Take naproxen as prescribed for abdominal pain. We recommend he follow-up with a primary care doctor and/or OB/GYN for persistent symptoms. Return to the emergency department if symptoms worsen, such as if you develop worsening abdominal pain, fever over 100.52F, or uncontrolled nausea/vomiting. Follow-up with the health department in 2 days regarding the results of your STD tests.

## 2015-11-27 NOTE — ED Notes (Signed)
Patient transported to ultrasound via stretcher.

## 2016-09-04 LAB — GLUCOSE, POCT (MANUAL RESULT ENTRY): POC Glucose: 162 mg/dl — AB (ref 70–99)

## 2016-09-26 ENCOUNTER — Ambulatory Visit (INDEPENDENT_AMBULATORY_CARE_PROVIDER_SITE_OTHER): Payer: Self-pay | Admitting: Physician Assistant

## 2018-05-29 ENCOUNTER — Ambulatory Visit (INDEPENDENT_AMBULATORY_CARE_PROVIDER_SITE_OTHER): Payer: Medicaid Other | Admitting: Primary Care

## 2018-05-29 ENCOUNTER — Other Ambulatory Visit: Payer: Self-pay

## 2018-05-29 ENCOUNTER — Ambulatory Visit (INDEPENDENT_AMBULATORY_CARE_PROVIDER_SITE_OTHER): Payer: Self-pay

## 2018-05-29 ENCOUNTER — Encounter (INDEPENDENT_AMBULATORY_CARE_PROVIDER_SITE_OTHER): Payer: Self-pay | Admitting: Primary Care

## 2018-05-29 VITALS — BP 98/63 | HR 108 | Temp 97.7°F | Ht 68.0 in | Wt 155.4 lb

## 2018-05-29 DIAGNOSIS — K029 Dental caries, unspecified: Secondary | ICD-10-CM

## 2018-05-29 DIAGNOSIS — J069 Acute upper respiratory infection, unspecified: Secondary | ICD-10-CM

## 2018-05-29 DIAGNOSIS — Z0000 Encounter for general adult medical examination without abnormal findings: Secondary | ICD-10-CM

## 2018-05-29 DIAGNOSIS — Z833 Family history of diabetes mellitus: Secondary | ICD-10-CM

## 2018-05-29 DIAGNOSIS — K047 Periapical abscess without sinus: Secondary | ICD-10-CM | POA: Diagnosis not present

## 2018-05-29 MED ORDER — AMOXICILLIN 500 MG PO CAPS: 500.0000 mg | ORAL_CAPSULE | Freq: Three times a day (TID) | ORAL | 0 refills | Status: DC

## 2018-05-29 MED ORDER — FLUCONAZOLE 150 MG PO TABS: 150.0000 mg | ORAL_TABLET | Freq: Once | ORAL | 0 refills | Status: AC

## 2018-05-29 NOTE — Progress Notes (Signed)
Established Patient Office Visit  Subjective:  Patient ID: Andrea Wolfe, female    DOB: [birth year]  Age: 38 y.o. MRN: [id number]  CC: Establish Care Chief Complaint  Patient presents with  . New Patient (Initial Visit)    HPI INDIA JOLIN presents for to establish care. She is a 38 year old AA female with complains of upper respiratory symptoms. She admits to drinking alcohol and smokes 1 ppd cigarettes. She also, present with poor dental carries.  Past Medical History:  Diagnosis Date  . Medical history non-contributory     Past Surgical History:  Procedure Laterality Date  . FOOT SURGERY    . INDUCED ABORTION      Family History  Problem Relation Age of Onset  . Hypertension Mother   . Diabetes Mother     Social History   Socioeconomic History  . Marital status: Single    Spouse name: Not on file  . Number of children: Not on file  . Years of education: Not on file  . Highest education level: Not on file  Occupational History  . Not on file  Social Needs  . Financial resource strain: Not on file  . Food insecurity:    Worry: Not on file    Inability: Not on file  . Transportation needs:    Medical: Not on file    Non-medical: Not on file  Tobacco Use  . Smoking status: Current Some Day Smoker    Packs/day: 0.50    Types: Cigarettes  . Smokeless tobacco: Never Used  Substance and Sexual Activity  . Alcohol use: Yes  . Drug use: Yes    Comment: last smoked 07/11/14  . Sexual activity: Yes  Lifestyle  . Physical activity:    Days per week: Not on file    Minutes per session: Not on file  . Stress: Not on file  Relationships  . Social connections:    Talks on phone: Not on file    Gets together: Not on file    Attends religious service: Not on file    Active member of club or organization: Not on file    Attends meetings of clubs or organizations: Not on file    Relationship status: Not on file  . Intimate partner violence:    Fear of  current or ex partner: Not on file    Emotionally abused: Not on file    Physically abused: Not on file    Forced sexual activity: Not on file  Other Topics Concern  . Not on file  Social History Narrative  . Not on file    Outpatient Medications Prior to Visit  Medication Sig Dispense Refill  . acetaminophen (TYLENOL) 325 MG tablet Take 650 mg by mouth every 6 (six) hours as needed for moderate pain.    . diphenhydramine-acetaminophen (TYLENOL PM) 25-500 MG TABS Take 2 tablets by mouth at bedtime as needed (pain).    Marland Kitchen ibuprofen (ADVIL,MOTRIN) 600 MG tablet Take 1 tablet (600 mg total) by mouth every 6 (six) hours. (Patient not taking: Reported on 07/12/2014) 30 tablet 0  . naproxen (NAPROSYN) 500 MG tablet Take 1 tablet (500 mg total) by mouth 2 (two) times daily. 30 tablet 0   No facility-administered medications prior to visit.     No Known Allergies  ROS Review of Systems  Constitutional: Negative.   HENT: Positive for congestion.   Eyes: Negative.   Respiratory: Positive for chest tightness and wheezing.  Cardiovascular: Negative.   Gastrointestinal: Negative.   Endocrine: Negative.   Genitourinary: Negative.   Musculoskeletal: Positive for arthralgias.  Skin: Negative.   Allergic/Immunologic: Negative.   Neurological: Negative.   Hematological: Negative.   Psychiatric/Behavioral: Positive for agitation.      Objective:    Physical Exam  Constitutional: She is oriented to person, place, and time. She appears well-developed.  HENT:  Head: Normocephalic.  Eyes: Pupils are equal, round, and reactive to light.  Neck: Normal range of motion.  Cardiovascular: Normal rate and regular rhythm.  Pulmonary/Chest: She has wheezes.  Abdominal: Soft. Bowel sounds are normal.  Musculoskeletal: Normal range of motion.  Neurological: She is oriented to person, place, and time.  Skin: Skin is warm and dry.  Psychiatric: She has a normal mood and affect.    BP 98/63 (BP  Location: Left Arm, Patient Position: Sitting, Cuff Size: Normal)   Pulse (!) 108   Temp 97.7 F (36.5 C) (Oral)   Ht 5' 8"  (1.727 m)   Wt 155 lb 6.4 oz (70.5 kg)   LMP 05/16/2018 (Approximate)   SpO2 100%   BMI 23.63 kg/m  Wt Readings from Last 3 Encounters:  05/29/18 155 lb 6.4 oz (70.5 kg)  11/26/15 159 lb 4 oz (72.2 kg)  07/12/14 163 lb 6 oz (74.1 kg)     Health Maintenance Due  Topic Date Due  . HIV Screening  04/07/1995  . PAP SMEAR-Modifier  04/06/2001  . INFLUENZA VACCINE  11/13/2017    There are no preventive care reminders to display for this patient.  No results found for: TSH Lab Results  Component Value Date   WBC 11.5 (H) 11/26/2015   HGB 12.2 11/26/2015   HCT 36.7 11/26/2015   MCV 96.8 11/26/2015   PLT 280 11/26/2015   Lab Results  Component Value Date   NA 131 (L) 11/26/2015   K 3.6 11/26/2015   CO2 27 11/26/2015   GLUCOSE 93 11/26/2015   BUN 13 11/26/2015   CREATININE 1.22 (H) 11/26/2015   BILITOT 0.4 11/26/2015   ALKPHOS 37 (L) 11/26/2015   AST 24 11/26/2015   ALT 15 11/26/2015   PROT 5.9 (L) 11/26/2015   ALBUMIN 3.3 (L) 11/26/2015   CALCIUM 8.4 (L) 11/26/2015   ANIONGAP 3 (L) 11/26/2015   No results found for: CHOL No results found for: HDL No results found for: LDLCALC No results found for: TRIG No results found for: CHOLHDL No results found for: HGBA1C    Assessment & Plan:  Treniyah was seen today for new patient (initial visit).  Diagnoses and all orders for this visit:  Encounter for medical examination to establish care -     CBC with Differential -     CMP14+EGFR  Acute upper respiratory infection Lungs are CTA , boggy nares and cough probable viral tx s/s as they present OTC cough medication and tylenol or ibuprofen if fever occurs. If bacterial pt is placed on abt's for dental abscess. .  Infected dental carries Patient will be given a list of Medicaid dental providers and placed on abt's for dental abscess   Family  history of diabetes mellitus in mother -     Hemoglobin A1c- POCT  Other orders -     amoxicillin (AMOXIL) 500 MG capsule; Take 1 capsule (500 mg total) by mouth 3 (three) times daily. -     fluconazole (DIFLUCAN) 150 MG tablet; Take 1 tablet (150 mg total) by mouth once for 1 dose.  Follow-up:  Schedule pap and start process of health maintenance     Kerin Perna, NP

## 2018-05-30 LAB — CBC WITH DIFFERENTIAL/PLATELET
Basophils Absolute: 0 10*3/uL (ref 0.0–0.2)
Basos: 0 %
EOS (ABSOLUTE): 0 10*3/uL (ref 0.0–0.4)
Eos: 0 %
HEMOGLOBIN: 13.8 g/dL (ref 11.1–15.9)
Hematocrit: 39.4 % (ref 34.0–46.6)
Immature Grans (Abs): 0 10*3/uL (ref 0.0–0.1)
Immature Granulocytes: 0 %
LYMPHS ABS: 2.1 10*3/uL (ref 0.7–3.1)
Lymphs: 28 %
MCH: 32.1 pg (ref 26.6–33.0)
MCHC: 35 g/dL (ref 31.5–35.7)
MCV: 92 fL (ref 79–97)
MONOS ABS: 0.9 10*3/uL (ref 0.1–0.9)
Monocytes: 11 %
Neutrophils Absolute: 4.7 10*3/uL (ref 1.4–7.0)
Neutrophils: 61 %
Platelets: 372 10*3/uL (ref 150–450)
RBC: 4.3 x10E6/uL (ref 3.77–5.28)
RDW: 11.9 % (ref 11.7–15.4)
WBC: 7.7 10*3/uL (ref 3.4–10.8)

## 2018-05-30 LAB — CMP14+EGFR
ALK PHOS: 68 IU/L (ref 39–117)
ALT: 11 IU/L (ref 0–32)
AST: 22 IU/L (ref 0–40)
Albumin/Globulin Ratio: 1.3 (ref 1.2–2.2)
Albumin: 4.4 g/dL (ref 3.8–4.8)
BUN/Creatinine Ratio: 10 (ref 9–23)
BUN: 9 mg/dL (ref 6–20)
CHLORIDE: 96 mmol/L (ref 96–106)
CO2: 24 mmol/L (ref 20–29)
Calcium: 9.3 mg/dL (ref 8.7–10.2)
Creatinine, Ser: 0.86 mg/dL (ref 0.57–1.00)
GFR calc Af Amer: 99 mL/min/{1.73_m2} (ref >59)
GFR calc non Af Amer: 86 mL/min/{1.73_m2} (ref >59)
Globulin, Total: 3.5 g/dL (ref 1.5–4.5)
Glucose: 97 mg/dL (ref 65–99)
Potassium: 4.3 mmol/L (ref 3.5–5.2)
SODIUM: 140 mmol/L (ref 134–144)
Total Protein: 7.9 g/dL (ref 6.0–8.5)

## 2018-05-30 LAB — HEMOGLOBIN A1C
Est. average glucose Bld gHb Est-mCnc: 117 mg/dL
HEMOGLOBIN A1C: 5.7 % — AB (ref 4.8–5.6)

## 2018-06-17 ENCOUNTER — Other Ambulatory Visit (HOSPITAL_COMMUNITY)
Admission: RE | Admit: 2018-06-17 | Discharge: 2018-06-17 | Disposition: A | Discharge status: Home / Self Care | Payer: Medicaid Other | Source: Ambulatory Visit | Attending: Primary Care | Admitting: Primary Care

## 2018-06-17 ENCOUNTER — Encounter (INDEPENDENT_AMBULATORY_CARE_PROVIDER_SITE_OTHER): Payer: Self-pay | Admitting: Primary Care

## 2018-06-17 ENCOUNTER — Other Ambulatory Visit: Payer: Self-pay

## 2018-06-17 ENCOUNTER — Ambulatory Visit (INDEPENDENT_AMBULATORY_CARE_PROVIDER_SITE_OTHER): Payer: Medicaid Other | Admitting: Primary Care

## 2018-06-17 VITALS — BP 124/81 | HR 80 | Temp 98.3°F | Ht 67.5 in | Wt 157.8 lb

## 2018-06-17 DIAGNOSIS — Z113 Encounter for screening for infections with a predominantly sexual mode of transmission: Secondary | ICD-10-CM

## 2018-06-17 DIAGNOSIS — Z124 Encounter for screening for malignant neoplasm of cervix: Secondary | ICD-10-CM

## 2018-06-17 DIAGNOSIS — F17200 Nicotine dependence, unspecified, uncomplicated: Secondary | ICD-10-CM | POA: Diagnosis not present

## 2018-06-17 DIAGNOSIS — R7303 Prediabetes: Secondary | ICD-10-CM

## 2018-06-17 DIAGNOSIS — Z23 Encounter for immunization: Secondary | ICD-10-CM

## 2018-06-17 MED ORDER — FLUCONAZOLE 150 MG PO TABS: 150.0000 mg | ORAL_TABLET | Freq: Once | ORAL | 0 refills | Status: AC

## 2018-06-17 NOTE — Progress Notes (Signed)
Established Patient Office Visit  Subjective:  Patient ID: Andrea Wolfe, female    DOB: 1980/06/09  Age: 38 y.o. MRN: [id number]  CC: No chief complaint on file.   HPI REIGHN KAPLAN presents for a well woman visit.   Past Medical History:  Diagnosis Date  . Medical history non-contributory     Past Surgical History:  Procedure Laterality Date  . FOOT SURGERY    . INDUCED ABORTION      Family History  Problem Relation Age of Onset  . Hypertension Mother   . Diabetes Mother     Social History   Socioeconomic History  . Marital status: Single    Spouse name: Not on file  . Number of children: Not on file  . Years of education: Not on file  . Highest education level: Not on file  Occupational History  . Not on file  Social Needs  . Financial resource strain: Not on file  . Food insecurity:    Worry: Not on file    Inability: Not on file  . Transportation needs:    Medical: Not on file    Non-medical: Not on file  Tobacco Use  . Smoking status: Current Some Day Smoker    Packs/day: 0.50    Types: Cigarettes  . Smokeless tobacco: Never Used  Substance and Sexual Activity  . Alcohol use: Yes  . Drug use: Yes    Comment: last smoked 07/11/14  . Sexual activity: Yes  Lifestyle  . Physical activity:    Days per week: Not on file    Minutes per session: Not on file  . Stress: Not on file  Relationships  . Social connections:    Talks on phone: Not on file    Gets together: Not on file    Attends religious service: Not on file    Active member of club or organization: Not on file    Attends meetings of clubs or organizations: Not on file    Relationship status: Not on file  . Intimate partner violence:    Fear of current or ex partner: Not on file    Emotionally abused: Not on file    Physically abused: Not on file    Forced sexual activity: Not on file  Other Topics Concern  . Not on file  Social History Narrative  . Not on file     Outpatient Medications Prior to Visit  Medication Sig Dispense Refill  . amoxicillin (AMOXIL) 500 MG capsule Take 1 capsule (500 mg total) by mouth 3 (three) times daily. 30 capsule 0   No facility-administered medications prior to visit.     No Known Allergies  ROS Review of Systems  HENT: Positive for dental problem.   Respiratory: Negative.   Cardiovascular: Negative.   Gastrointestinal: Negative.   Genitourinary: Negative.   Psychiatric/Behavioral: Negative.       Objective:    Physical Exam  Constitutional: She is oriented to person, place, and time. She appears well-developed.  Cardiovascular: Normal rate and regular rhythm.  Pulmonary/Chest: Effort normal and breath sounds normal.  Genitourinary: There is tenderness on the right labia. There is no rash, lesion or injury on the right labia. There is tenderness on the left labia. There is no rash, lesion or injury on the left labia.    Vaginal discharge present.   Lymphadenopathy:       Right: Inguinal adenopathy present.       Left: Inguinal adenopathy present.  Neurological: She is alert and oriented to person, place, and time.  Psychiatric: She has a normal mood and affect.    BP 124/81 (BP Location: Right Arm, Patient Position: Sitting, Cuff Size: Normal)   Pulse 80   Temp 98.3 F (36.8 C) (Oral)   Ht 5' 7.5" (1.715 m)   Wt 157 lb 12.8 oz (71.6 kg)   LMP 05/16/2018 (Exact Date)   SpO2 100%   BMI 24.35 kg/m  Wt Readings from Last 3 Encounters:  06/17/18 157 lb 12.8 oz (71.6 kg)  05/29/18 155 lb 6.4 oz (70.5 kg)  11/26/15 159 lb 4 oz (72.2 kg)     Health Maintenance Due  Topic Date Due  . PAP SMEAR-Modifier  04/06/2001  . INFLUENZA VACCINE  11/13/2017    There are no preventive care reminders to display for this patient.  No results found for: TSH Lab Results  Component Value Date   WBC 7.7 05/29/2018   HGB 13.8 05/29/2018   HCT 39.4 05/29/2018   MCV 92 05/29/2018   PLT 372 05/29/2018    Lab Results  Component Value Date   NA 140 05/29/2018   K 4.3 05/29/2018   CO2 24 05/29/2018   GLUCOSE 97 05/29/2018   BUN 9 05/29/2018   CREATININE 0.86 05/29/2018   BILITOT <0.2 05/29/2018   ALKPHOS 68 05/29/2018   AST 22 05/29/2018   ALT 11 05/29/2018   PROT 7.9 05/29/2018   ALBUMIN 4.4 05/29/2018   CALCIUM 9.3 05/29/2018   ANIONGAP 3 (L) 11/26/2015   No results found for: CHOL No results found for: HDL No results found for: LDLCALC No results found for: TRIG No results found for: CHOLHDL Lab Results  Component Value Date   HGBA1C 5.7 (H) 05/29/2018      1. Cervical cancer screening Pap smear obtain   2. Screening examination for STD (sexually transmitted disease) Wet prep obtain   3. Smoking Each visit smoking cessation   4. Pre-diabetes Review labs A1C 5.7 discussed pre diabetes foods to modify  Assessment & Plan:   Problem List Items Addressed This Visit    None    Visit Diagnoses    Cervical cancer screening    -  Primary   Relevant Orders   Cytology - PAP([place])   Screening examination for STD (sexually transmitted disease)       Relevant Orders   WET PREP BY MOLECULAR PROBE   Smoking       Pre-diabetes       Need for immunization against influenza       Relevant Orders   Flu Vaccine QUAD 36+ mos IM (Completed)      Meds ordered this encounter  Medications  . fluconazole (DIFLUCAN) 150 MG tablet    Sig: Take 1 tablet (150 mg total) by mouth once for 1 dose.    Dispense:  2 tablet    Refill:  0    Follow-up: Return in about 6 months (around 12/18/2018) for annual visit .    Kerin Perna, NP

## 2018-06-17 NOTE — Patient Instructions (Addendum)
Cervical Cancer  Cervical cancer is abnormal growth of cells in the cervix. The cervix is the opening and bottom part of the uterus. It is between the vagina and the uterus. There are three main types of cervical cancer:  Squamous cell carcinoma. This type of cancer starts in the cells that line the surface of the cervix.  Adenocarcinoma. This type of cervical cancer starts in the gland cells that line the cervix (glandular).  Cervical sarcoma. This is a rare tumor that is thought to follow an aggressive course. What are the causes? Most cervical cancers are caused by a virus called human papillomavirus (HPV). What increases the risk? This condition is more likely to develop in women who:  Have a sexually transmitted viral infection. These include: ? Chlamydia. ? Herpes. ? HPV.  Are between the ages of 92-50.  Were sexually active before age 14 years.  Are of African American, Hispanic, Asian, or [place] origin.  Have more than one sexual partner, or are having sex with someone who has more than one sexual partner.  Do not use condoms with sexual partners.  Have had cancer of the vagina or vulva.  Use oral contraceptives, also called birth control pills.  Who smoke or breathe in second hand smoke.  Have a weakened immune system.  Whose mothers took diethylstilbestrol (DES) during pregnancy.  Have a mother or sister who has had cervical cancer.  Have a history of dysplasia of the cervix. What are the signs or symptoms? Symptoms are usually not present in the early stages of cervical cancer. Once the cancer is in the cervix and spreads to surrounding tissues, symptoms may include:  Abnormal vaginal bleeding or menstrual bleeding that is longer or heavier than usual.  Vaginal bleeding after sex, douching, or a Pap test.  Vaginal bleeding following menopause.  Abnormal vaginal discharge.  Pelvic discomfort or pain.  An abnormal Pap test.  Pain during  sex.  Tiredness (fatigue). How is this diagnosed? This condition is diagnosed based on your medical history and a physical exam, which includes a pelvic exam and Pap test. Your health care provider may do additional tests or procedures, such as:  A colposcopy. This is a procedure that uses a microscope to magnify and closely examine the cells of the cervix, vagina, and vulva.  Cervical biopsies. This is a procedure where small tissue samples are taken from the cervix to be examined under a microscope.  A cone biopsy. This is a procedure to test for or remove cancerous tissue. Other imaging tests may be done, including:  Ultrasound.  CT scan.  MRI.  PET scan. Other tests may be needed to check for cancer cells that have spread (metastasis). If cervical cancer is confirmed, it will be staged to determine its severity and extent. Staging is an assessment of:  The size of the tumor.  Whether the cancer has spread.  Where the cancer has spread. How is this treated? Treatment for this condition depends on the stage of the cancer. Treatment may include:  Cone biopsy to remove the cancerous tissue.  Removal of the entire uterus and cervix.  Removal of the uterus, cervix, upper vagina, lymph nodes, and surrounding tissue (modified radical hysterectomy). The ovaries may be left in place or removed.  Medicines to treat cancer. These include chemotherapy or targeted therapy.  A combination of surgery, radiation, and chemotherapy.  Biological therapy. These are substances that help strengthen your immune system's fight against cancer or infection. They may  be used in combination with chemotherapy. Follow these instructions at home:  Take over-the-counter and prescription medicines only as told by your health care provider.  Do not use any products that contain nicotine or tobacco, such as cigarettes and e-cigarettes. If you need help quitting, ask your health care provider.  Do not  have sex until your health care provider says it is okay.  Use a condom every time you have sex.  Consider joining a support group with others who have a diagnosis of cervical cancer.  Keep all follow-up visits as told by your health care provider. This is important. How is this prevented?  Getting vaccinated with the HPV vaccine can prevent most cervical cancers that occur. Where to find more information  [place]: www.cancer.gov  American Cancer Society: www.cancer.org Contact a health care provider if:  You have pelvic pain or pressure.  You have leg or back pain.  You have a fever.  You have abnormal vaginal bleeding or discharge.  You lose weight.  You develop a cough. Get help right away if:  You cannot urinate.  You have blood in your urine.  You have blood in your stool.  You develop severe back, stomach, or pelvic pain. Summary  Cervical cancer is abnormal growth of cells in the cervix. The cervix is the opening and bottom part of the uterus between the vagina and the uterus.  Most cervical cancers are caused by a virus called human papillomavirus (HPV).  Treatment for this condition depends on the stage of the cancer. Treatment may include a combination of surgery, radiation, and chemotherapy.  Getting vaccinated with the HPV vaccine can prevent most cervical cancers that occur. This information is not intended to replace advice given to you by your health care provider. Make sure you discuss any questions you have with your health care provider. Document Released: 04/01/2005 Document Revised: 05/06/2016 Document Reviewed: 05/06/2016 Elsevier Interactive Patient Education  2019 [employer] Inc[employer]  Prediabetes Eating Plan Prediabetes is a condition that causes blood sugar (glucose) levels to be higher than normal. This increases the risk for developing diabetes. In order to prevent diabetes from developing, your health care provider may  recommend a diet and other lifestyle changes to help you:  Control your blood glucose levels.  Improve your cholesterol levels.  Manage your blood pressure. Your health care provider may recommend working with a diet and nutrition specialist (dietitian) to make a meal plan that is best for you. What are tips for following this plan? Lifestyle  Set weight loss goals with the help of your health care team. It is recommended that most people with prediabetes lose 7% of their current body weight.  Exercise for at least 30 minutes at least 5 days a week.  Attend a support group or seek ongoing support from a mental health counselor.  Take over-the-counter and prescription medicines only as told by your health care provider. Reading food labels  Read food labels to check the amount of fat, salt (sodium), and sugar in prepackaged foods. Avoid foods that have: ? Saturated fats. ? Trans fats. ? Added sugars.  Avoid foods that have more than 300 milligrams (mg) of sodium per serving. Limit your daily sodium intake to less than 2,300 mg each day. Shopping  Avoid buying pre-made and processed foods. Cooking  Cook with olive oil. Do not use butter, lard, or ghee.  Bake, broil, grill, or boil foods. Avoid frying. Meal planning   Work with your dietitian to  develop an eating plan that is right for you. This may include: ? Tracking how many calories you take in. Use a food diary, notebook, or mobile application to track what you eat at each meal. ? Using the glycemic index (GI) to plan your meals. The index tells you how quickly a food will raise your blood glucose. Choose low-GI foods. These foods take a longer time to raise blood glucose.  Consider following a Mediterranean diet. This diet includes: ? Several servings each day of fresh fruits and vegetables. ? Eating fish at least twice a week. ? Several servings each day of whole grains, beans, nuts, and seeds. ? Using olive oil  instead of other fats. ? Moderate alcohol consumption. ? Eating small amounts of red meat and whole-fat dairy.  If you have high blood pressure, you may need to limit your sodium intake or follow a diet such as the DASH eating plan. DASH is an eating plan that aims to lower high blood pressure. What foods are recommended? The items listed below may not be a complete list. Talk with your dietitian about what dietary choices are best for you. Grains Whole grains, such as whole-wheat or whole-grain breads, crackers, cereals, and pasta. Unsweetened oatmeal. Bulgur. Barley. Quinoa. Brown rice. Corn or whole-wheat flour tortillas or taco shells. Vegetables Lettuce. Spinach. Peas. Beets. Cauliflower. Cabbage. Broccoli. Carrots. Tomatoes. Squash. Eggplant. Herbs. Peppers. Onions. Cucumbers. Brussels sprouts. Fruits Berries. Bananas. Apples. Oranges. Grapes. Papaya. Mango. Pomegranate. Kiwi. Grapefruit. Cherries. Meats and other protein foods Seafood. Poultry without skin. Lean cuts of pork and beef. Tofu. Eggs. Nuts. Beans. Dairy Low-fat or fat-free dairy products, such as yogurt, cottage cheese, and cheese. Beverages Water. Tea. Coffee. Sugar-free or diet soda. Seltzer water. Lowfat or no-fat milk. Milk alternatives, such as soy or almond milk. Fats and oils Olive oil. Canola oil. Sunflower oil. Grapeseed oil. Avocado. Walnuts. Sweets and desserts Sugar-free or low-fat pudding. Sugar-free or low-fat ice cream and other frozen treats. Seasoning and other foods Herbs. Sodium-free spices. Mustard. Relish. Low-fat, low-sugar ketchup. Low-fat, low-sugar barbecue sauce. Low-fat or fat-free mayonnaise. What foods are not recommended? The items listed below may not be a complete list. Talk with your dietitian about what dietary choices are best for you. Grains Refined white flour and flour products, such as bread, pasta, snack foods, and cereals. Vegetables Canned vegetables. Frozen vegetables with  butter or cream sauce. Fruits Fruits canned with syrup. Meats and other protein foods Fatty cuts of meat. Poultry with skin. Breaded or fried meat. Processed meats. Dairy Full-fat yogurt, cheese, or milk. Beverages Sweetened drinks, such as sweet iced tea and soda. Fats and oils Butter. Lard. Ghee. Sweets and desserts Baked goods, such as cake, cupcakes, pastries, cookies, and cheesecake. Seasoning and other foods Spice mixes with added salt. Ketchup. Barbecue sauce. Mayonnaise. Summary  To prevent diabetes from developing, you may need to make diet and other lifestyle changes to help control blood sugar, improve cholesterol levels, and manage your blood pressure.  Set weight loss goals with the help of your health care team. It is recommended that most people with prediabetes lose 7 percent of their current body weight.  Consider following a Mediterranean diet that includes plenty of fresh fruits and vegetables, whole grains, beans, nuts, seeds, fish, lean meat, low-fat dairy, and healthy oils. This information is not intended to replace advice given to you by your health care provider. Make sure you discuss any questions you have with your health care provider. Document Released: 08/16/2014  Document Revised: 06/05/2016 Document Reviewed: 06/05/2016 Elsevier Interactive Patient Education  [employer].

## 2018-06-18 LAB — CERVICOVAGINAL ANCILLARY ONLY
Bacterial vaginitis: POSITIVE — AB
Candida vaginitis: NEGATIVE
Chlamydia: NEGATIVE
Neisseria Gonorrhea: NEGATIVE
Trichomonas: POSITIVE — AB

## 2018-06-22 ENCOUNTER — Other Ambulatory Visit (INDEPENDENT_AMBULATORY_CARE_PROVIDER_SITE_OTHER): Payer: Self-pay | Admitting: Primary Care

## 2018-06-22 LAB — CYTOLOGY - PAP: DIAGNOSIS: NEGATIVE

## 2018-06-22 MED ORDER — METRONIDAZOLE 500 MG PO TABS: 500.0000 mg | ORAL_TABLET | Freq: Two times a day (BID) | ORAL | 0 refills | Status: DC

## 2018-06-23 ENCOUNTER — Telehealth (INDEPENDENT_AMBULATORY_CARE_PROVIDER_SITE_OTHER): Payer: Self-pay

## 2018-06-23 NOTE — Telephone Encounter (Signed)
-----   Message from Kerin Perna, NP sent at 06/22/2018 12:32 PM EDT ----- Pt has abnormal pap sent in flagyl do not drink with medication

## 2018-06-23 NOTE — Telephone Encounter (Signed)
Patient is aware that pap normal, but urine cytology positive for BV and trichomonas (Sexually transmitted disease). She is aware that antibiotic has been sent to pharmacy to treat both BV and trich. Advised patient to abstain from alcohol and sex while taking abx. Nat Christen, CMA

## 2018-10-29 ENCOUNTER — Other Ambulatory Visit: Payer: Self-pay

## 2018-10-29 ENCOUNTER — Encounter (INDEPENDENT_AMBULATORY_CARE_PROVIDER_SITE_OTHER): Payer: Self-pay | Admitting: Primary Care

## 2018-10-29 ENCOUNTER — Other Ambulatory Visit (HOSPITAL_COMMUNITY)
Admission: RE | Admit: 2018-10-29 | Discharge: 2018-10-29 | Disposition: A | Discharge status: Home / Self Care | Payer: Medicaid Other | Source: Ambulatory Visit | Attending: Primary Care | Admitting: Primary Care

## 2018-10-29 ENCOUNTER — Ambulatory Visit (INDEPENDENT_AMBULATORY_CARE_PROVIDER_SITE_OTHER): Payer: Medicaid Other | Admitting: Primary Care

## 2018-10-29 VITALS — BP 115/79 | HR 69 | Temp 98.6°F | Ht 67.5 in | Wt 152.2 lb

## 2018-10-29 DIAGNOSIS — N912 Amenorrhea, unspecified: Secondary | ICD-10-CM

## 2018-10-29 DIAGNOSIS — F17200 Nicotine dependence, unspecified, uncomplicated: Secondary | ICD-10-CM | POA: Diagnosis not present

## 2018-10-29 DIAGNOSIS — Z3002 Counseling and instruction in natural family planning to avoid pregnancy: Secondary | ICD-10-CM | POA: Diagnosis not present

## 2018-10-29 DIAGNOSIS — N898 Other specified noninflammatory disorders of vagina: Secondary | ICD-10-CM | POA: Diagnosis not present

## 2018-10-29 DIAGNOSIS — Z114 Encounter for screening for human immunodeficiency virus [HIV]: Secondary | ICD-10-CM | POA: Diagnosis not present

## 2018-10-29 LAB — POCT URINE PREGNANCY: Preg Test, Ur: NEGATIVE

## 2018-10-29 MED ORDER — MEDROXYPROGESTERONE ACETATE 150 MG/ML IM SUSP
150.0000 mg | Freq: Once | INTRAMUSCULAR | Status: AC
  Administered 2018-10-29: 150 mg via INTRAMUSCULAR

## 2018-10-29 MED ORDER — METRONIDAZOLE 500 MG PO TABS: 500.0000 mg | ORAL_TABLET | Freq: Two times a day (BID) | ORAL | 0 refills | Status: DC

## 2018-10-29 MED ORDER — LEVONORGESTREL 1.5 MG PO TABS: 1.5000 mg | ORAL_TABLET | Freq: Once | ORAL | 0 refills | Status: DC

## 2018-10-29 NOTE — Progress Notes (Signed)
Acute Office Visit  Subjective:    Patient ID: Andrea Wolfe, female    DOB: Aug 28, 1980, 38 y.o.   MRN: [id number]  Chief Complaint  Patient presents with  . Exposure to STD  . bacterial vaginitis    needs new Rx for flagyl; lost old     HPI Patient is in today for vaginal discharge and she had protected sex however the condone was defective. No significant past medical history. She is also requesting a pregnancy test. (negative results)   Past Medical History:  Diagnosis Date  . Medical history non-contributory     Past Surgical History:  Procedure Laterality Date  . FOOT SURGERY    . INDUCED ABORTION      Family History  Problem Relation Age of Onset  . Hypertension Mother   . Diabetes Mother     Social History   Socioeconomic History  . Marital status: Single    Spouse name: Not on file  . Number of children: Not on file  . Years of education: Not on file  . Highest education level: Not on file  Occupational History  . Not on file  Social Needs  . Financial resource strain: Not on file  . Food insecurity    Worry: Not on file    Inability: Not on file  . Transportation needs    Medical: Not on file    Non-medical: Not on file  Tobacco Use  . Smoking status: Current Some Day Smoker    Packs/day: 0.50    Types: Cigarettes  . Smokeless tobacco: Never Used  Substance and Sexual Activity  . Alcohol use: Yes  . Drug use: Yes    Comment: last smoked 07/11/14  . Sexual activity: Yes  Lifestyle  . Physical activity    Days per week: Not on file    Minutes per session: Not on file  . Stress: Not on file  Relationships  . Social Herbalist on phone: Not on file    Gets together: Not on file    Attends religious service: Not on file    Active member of club or organization: Not on file    Attends meetings of clubs or organizations: Not on file    Relationship status: Not on file  . Intimate partner violence    Fear of current or ex  partner: Not on file    Emotionally abused: Not on file    Physically abused: Not on file    Forced sexual activity: Not on file  Other Topics Concern  . Not on file  Social History Narrative  . Not on file    Outpatient Medications Prior to Visit  Medication Sig Dispense Refill  . amoxicillin (AMOXIL) 500 MG capsule Take 1 capsule (500 mg total) by mouth 3 (three) times daily. 30 capsule 0  . metroNIDAZOLE (FLAGYL) 500 MG tablet Take 1 tablet (500 mg total) by mouth 2 (two) times daily. 14 tablet 0   No facility-administered medications prior to visit.     No Known Allergies  Review of Systems  Gastrointestinal: Positive for abdominal pain.  Genitourinary:       Vaginal discharge        Objective:    Physical Exam  Constitutional: She is oriented to person, place, and time. She appears well-developed.  HENT:  Head: Normocephalic.  Cardiovascular: Normal rate and regular rhythm.  Pulmonary/Chest: Effort normal and breath sounds normal.  Genitourinary:    Vaginal discharge  present.   Musculoskeletal: Normal range of motion.  Neurological: She is oriented to person, place, and time.  Skin: Skin is warm and dry.  Psychiatric: She has a normal mood and affect.    BP 115/79 (BP Location: Left Arm, Patient Position: Sitting, Cuff Size: Normal)   Pulse 69   Temp 98.6 F (37 C) (Tympanic)   Ht 5' 7.5" (1.715 m)   Wt 152 lb 3.2 oz (69 kg)   LMP 09/16/2018 (Approximate)   SpO2 100%   BMI 23.49 kg/m  Wt Readings from Last 3 Encounters:  10/29/18 152 lb 3.2 oz (69 kg)  06/17/18 157 lb 12.8 oz (71.6 kg)  05/29/18 155 lb 6.4 oz (70.5 kg)    There are no preventive care reminders to display for this patient.  There are no preventive care reminders to display for this patient.   No results found for: TSH Lab Results  Component Value Date   WBC 7.7 05/29/2018   HGB 13.8 05/29/2018   HCT 39.4 05/29/2018   MCV 92 05/29/2018   PLT 372 05/29/2018   Lab Results   Component Value Date   NA 140 05/29/2018   K 4.3 05/29/2018   CO2 24 05/29/2018   GLUCOSE 97 05/29/2018   BUN 9 05/29/2018   CREATININE 0.86 05/29/2018   BILITOT <0.2 05/29/2018   ALKPHOS 68 05/29/2018   AST 22 05/29/2018   ALT 11 05/29/2018   PROT 7.9 05/29/2018   ALBUMIN 4.4 05/29/2018   CALCIUM 9.3 05/29/2018   ANIONGAP 3 (L) 11/26/2015   No results found for: CHOL No results found for: HDL No results found for: LDLCALC No results found for: TRIG No results found for: CHOLHDL Lab Results  Component Value Date   HGBA1C 5.7 (H) 05/29/2018       Assessment & Plan:   Problem List Items Addressed This Visit    None    Visit Diagnoses    Amenorrhea    -  Primary   Relevant Orders   POCT urine pregnancy (Completed)   Smoking       Vaginal discharge       Relevant Orders   Cervicovaginal ancillary only   Encounter for counseling and instruction in natural family planning to avoid pregnancy       Relevant Medications   medroxyPROGESTERone (DEPO-PROVERA) injection 150 mg (Completed)   Encounter for screening for HIV       Relevant Orders   HIV antibody (with reflex)     Andrea Wolfe was seen today for exposure to std and bacterial vaginitis.  Diagnoses and all orders for this visit:  Amenorrhea -     POCT urine pregnancy  Smoking Nicotine affect every organ in the body second leading cause of death.  Increased risk for lung cancer and other respiratory diseases recommend cessation.  This will be reminded at each clinical visit.   Vaginal discharge She will do a self swab for previous dx of BV and Trichomonas. She started her prescription and lost her medications. Refilled flagyl and will re-evaluate test results  Encounter for counseling and instruction in natural family planning to avoid pregnancy Condoms due reduce the risk of STD and pregnancy but if defective risk for pregnancy and STD. She will start depo for birthcontrol due every three months. Pregnancy  test negative.    Other orders -     Discontinue: levonorgestrel (PLAN B ONE-STEP) 1.5 MG tablet; Take 1 tablet (1.5 mg total) by mouth once for 1 dose.  Meds ordered this encounter  Medications  . DISCONTD: levonorgestrel (PLAN B ONE-STEP) 1.5 MG tablet    Sig: Take 1 tablet (1.5 mg total) by mouth once for 1 dose.    Dispense:  1 tablet    Refill:  0  . medroxyPROGESTERone (DEPO-PROVERA) injection 150 mg  . metroNIDAZOLE (FLAGYL) 500 MG tablet    Sig: Take 1 tablet (500 mg total) by mouth 2 (two) times daily.    Dispense:  7 tablet    Refill:  0     Kerin Perna, NP

## 2018-10-30 LAB — HIV ANTIBODY (ROUTINE TESTING W REFLEX): HIV Screen 4th Generation wRfx: NONREACTIVE

## 2018-11-02 LAB — CERVICOVAGINAL ANCILLARY ONLY
Bacterial vaginitis: POSITIVE — AB
Candida vaginitis: NEGATIVE
Chlamydia: NEGATIVE
Neisseria Gonorrhea: NEGATIVE
Trichomonas: NEGATIVE

## 2018-11-03 ENCOUNTER — Other Ambulatory Visit (INDEPENDENT_AMBULATORY_CARE_PROVIDER_SITE_OTHER): Payer: Self-pay | Admitting: Primary Care

## 2018-11-03 MED ORDER — METRONIDAZOLE 500 MG PO TABS: 500.0000 mg | ORAL_TABLET | Freq: Two times a day (BID) | ORAL | 0 refills | Status: AC

## 2018-11-05 ENCOUNTER — Encounter (INDEPENDENT_AMBULATORY_CARE_PROVIDER_SITE_OTHER): Payer: Self-pay

## 2018-12-14 ENCOUNTER — Other Ambulatory Visit: Payer: Self-pay

## 2018-12-14 DIAGNOSIS — R6889 Other general symptoms and signs: Secondary | ICD-10-CM | POA: Diagnosis not present

## 2018-12-14 DIAGNOSIS — Z20822 Contact with and (suspected) exposure to covid-19: Secondary | ICD-10-CM

## 2018-12-16 LAB — NOVEL CORONAVIRUS, NAA: SARS-CoV-2, NAA: NOT DETECTED

## 2018-12-18 ENCOUNTER — Ambulatory Visit (INDEPENDENT_AMBULATORY_CARE_PROVIDER_SITE_OTHER): Payer: Medicaid Other | Admitting: Primary Care

## 2019-01-06 DIAGNOSIS — Z5181 Encounter for therapeutic drug level monitoring: Secondary | ICD-10-CM | POA: Diagnosis not present

## 2019-01-13 DIAGNOSIS — Z5181 Encounter for therapeutic drug level monitoring: Secondary | ICD-10-CM | POA: Diagnosis not present

## 2019-01-25 ENCOUNTER — Encounter (INDEPENDENT_AMBULATORY_CARE_PROVIDER_SITE_OTHER): Payer: Self-pay | Admitting: Primary Care

## 2019-01-25 ENCOUNTER — Other Ambulatory Visit: Payer: Self-pay

## 2019-01-25 ENCOUNTER — Ambulatory Visit (INDEPENDENT_AMBULATORY_CARE_PROVIDER_SITE_OTHER): Payer: Medicaid Other | Admitting: Primary Care

## 2019-01-25 VITALS — BP 116/70 | HR 86 | Temp 97.1°F | Ht 67.5 in | Wt 160.2 lb

## 2019-01-25 DIAGNOSIS — F17210 Nicotine dependence, cigarettes, uncomplicated: Secondary | ICD-10-CM | POA: Diagnosis not present

## 2019-01-25 DIAGNOSIS — F329 Major depressive disorder, single episode, unspecified: Secondary | ICD-10-CM | POA: Diagnosis not present

## 2019-01-25 DIAGNOSIS — F17200 Nicotine dependence, unspecified, uncomplicated: Secondary | ICD-10-CM

## 2019-01-25 DIAGNOSIS — L84 Corns and callosities: Secondary | ICD-10-CM | POA: Diagnosis not present

## 2019-01-25 DIAGNOSIS — R5383 Other fatigue: Secondary | ICD-10-CM | POA: Diagnosis not present

## 2019-01-25 DIAGNOSIS — F32A Depression, unspecified: Secondary | ICD-10-CM

## 2019-01-25 NOTE — Patient Instructions (Addendum)

## 2019-01-25 NOTE — Progress Notes (Signed)
Acute Office Visit  Subjective:    Patient ID: Andrea Wolfe, female    DOB: 1981-03-08, 38 y.o.   MRN: [id number]  Chief Complaint  Patient presents with  . Contraception    depo injection   . Fatigue    HPI Patient is in today for depo injection for birth control. At this current time it has been order but not received. Andrea Wolfe is given the option to go to the pharmacy to pick the prescription or wait till  Wednesday medication. She is complaining of bilatateral foot pain but the left is worst than the right. Between her pinky toe there is skin break down. She is also overwhelm with school on line with her children. Feeling down and tired most of the time.   Past Medical History:  Diagnosis Date  . Medical history non-contributory     Past Surgical History:  Procedure Laterality Date  . FOOT SURGERY    . INDUCED ABORTION      Family History  Problem Relation Age of Onset  . Hypertension Mother   . Diabetes Mother     Social History   Socioeconomic History  . Marital status: Single    Spouse name: Not on file  . Number of children: Not on file  . Years of education: Not on file  . Highest education level: Not on file  Occupational History  . Not on file  Social Needs  . Financial resource strain: Not on file  . Food insecurity    Worry: Not on file    Inability: Not on file  . Transportation needs    Medical: Not on file    Non-medical: Not on file  Tobacco Use  . Smoking status: Current Some Day Smoker    Packs/day: 0.50    Types: Cigarettes  . Smokeless tobacco: Never Used  Substance and Sexual Activity  . Alcohol use: Yes  . Drug use: Yes    Comment: last smoked 07/11/14  . Sexual activity: Yes  Lifestyle  . Physical activity    Days per week: Not on file    Minutes per session: Not on file  . Stress: Not on file  Relationships  . Social Herbalist on phone: Not on file    Gets together: Not on file    Attends religious  service: Not on file    Active member of club or organization: Not on file    Attends meetings of clubs or organizations: Not on file    Relationship status: Not on file  . Intimate partner violence    Fear of current or ex partner: Not on file    Emotionally abused: Not on file    Physically abused: Not on file    Forced sexual activity: Not on file  Other Topics Concern  . Not on file  Social History Narrative  . Not on file    No outpatient medications prior to visit.   No facility-administered medications prior to visit.     No Known Allergies  Review of Systems  Eyes: Positive for redness.  Musculoskeletal:       Right shoulder pain s/p fall  Psychiatric/Behavioral: Positive for depression. The patient is nervous/anxious.   All other systems reviewed and are negative.      Objective:    Physical Exam  Constitutional: She is oriented to person, place, and time. She appears well-developed and well-nourished.  Thin built  HENT:  Head: Normocephalic.  Neck: Normal range of motion.  Cardiovascular: Normal rate and regular rhythm.  Pulmonary/Chest: Effort normal and breath sounds normal.  Abdominal: Soft. Bowel sounds are normal.  Musculoskeletal: Normal range of motion.  Neurological: She is oriented to person, place, and time.  Psychiatric: She has a normal mood and affect.    BP 116/70 (BP Location: Right Arm, Patient Position: Sitting, Cuff Size: Normal)   Pulse 86   Temp (!) 97.1 F (36.2 C) (Temporal)   Ht 5' 7.5" (1.715 m)   Wt 160 lb 3.2 oz (72.7 kg)   SpO2 100%   BMI 24.72 kg/m  Wt Readings from Last 3 Encounters:  01/25/19 160 lb 3.2 oz (72.7 kg)  10/29/18 152 lb 3.2 oz (69 kg)  06/17/18 157 lb 12.8 oz (71.6 kg)    There are no preventive care reminders to display for this patient.  There are no preventive care reminders to display for this patient.   No results found for: TSH Lab Results  Component Value Date   WBC 7.7 05/29/2018   HGB  13.8 05/29/2018   HCT 39.4 05/29/2018   MCV 92 05/29/2018   PLT 372 05/29/2018   Lab Results  Component Value Date   NA 140 05/29/2018   K 4.3 05/29/2018   CO2 24 05/29/2018   GLUCOSE 97 05/29/2018   BUN 9 05/29/2018   CREATININE 0.86 05/29/2018   BILITOT <0.2 05/29/2018   ALKPHOS 68 05/29/2018   AST 22 05/29/2018   ALT 11 05/29/2018   PROT 7.9 05/29/2018   ALBUMIN 4.4 05/29/2018   CALCIUM 9.3 05/29/2018   ANIONGAP 3 (L) 11/26/2015   No results found for: CHOL No results found for: HDL No results found for: LDLCALC No results found for: TRIG No results found for: CHOLHDL Lab Results  Component Value Date   HGBA1C 5.7 (H) 05/29/2018       Assessment & Plan:   Andrea Wolfe was seen today for contraception and fatigue.  Diagnoses and all orders for this visit:  Pre-ulcerative corn or callous Between the 4th and 5 th toe right foot and bunion on big toe -     Ambulatory referral to Podiatry  Fatigue, unspecified type Likely related to over whelm with stress. Children are home helping with on line school. Labs to rule out thyroid disease or anemia -     TSH + free T4 -     CBC with Differential -     CMP14+EGFR; Future -     CMP14+EGFR  Depression, unspecified depression type,home with children . Worrying about pandemic her and her children safety. Lack of socialization . We discused Depression can affect your thoughts and feelings, relatiohips, daily activities, and physical health. It is caused by changes in the way your brain functions. If you receive a diagnosis of depression, your health care provider will tell you which type of depression you have and what treatment options are available to you.  Smoking -   Each visit we will discuss cessation . She is well aware of the health risk associated with smoking.     No orders of the defined types were placed in this encounter.    Kerin Perna, NP

## 2019-01-25 NOTE — Progress Notes (Signed)
Pt complains of foot pain

## 2019-01-26 LAB — CBC WITH DIFFERENTIAL/PLATELET
Basophils Absolute: 0 10*3/uL (ref 0.0–0.2)
Basos: 0 %
EOS (ABSOLUTE): 0 10*3/uL (ref 0.0–0.4)
Eos: 0 %
Hematocrit: 38.8 % (ref 34.0–46.6)
Hemoglobin: 12.9 g/dL (ref 11.1–15.9)
Immature Grans (Abs): 0 10*3/uL (ref 0.0–0.1)
Immature Granulocytes: 0 %
Lymphocytes Absolute: 1.8 10*3/uL (ref 0.7–3.1)
Lymphs: 22 %
MCH: 32.3 pg (ref 26.6–33.0)
MCHC: 33.2 g/dL (ref 31.5–35.7)
MCV: 97 fL (ref 79–97)
Monocytes Absolute: 0.5 10*3/uL (ref 0.1–0.9)
Monocytes: 6 %
Neutrophils Absolute: 5.8 10*3/uL (ref 1.4–7.0)
Neutrophils: 72 %
Platelets: 312 10*3/uL (ref 150–450)
RBC: 3.99 x10E6/uL (ref 3.77–5.28)
RDW: 12 % (ref 11.7–15.4)
WBC: 8.2 10*3/uL (ref 3.4–10.8)

## 2019-01-26 LAB — CMP14+EGFR
ALT: 11 IU/L (ref 0–32)
AST: 19 IU/L (ref 0–40)
Albumin/Globulin Ratio: 1.6 (ref 1.2–2.2)
Albumin: 4.4 g/dL (ref 3.8–4.8)
Alkaline Phosphatase: 48 IU/L (ref 39–117)
BUN/Creatinine Ratio: 14 (ref 9–23)
BUN: 13 mg/dL (ref 6–20)
Bilirubin Total: <0.2 mg/dL (ref 0.0–1.2)
CO2: 22 mmol/L (ref 20–29)
Calcium: 8.9 mg/dL (ref 8.7–10.2)
Chloride: 101 mmol/L (ref 96–106)
Creatinine, Ser: 0.9 mg/dL (ref 0.57–1.00)
GFR calc Af Amer: 94 mL/min/{1.73_m2} (ref >59)
GFR calc non Af Amer: 81 mL/min/{1.73_m2} (ref >59)
Globulin, Total: 2.7 g/dL (ref 1.5–4.5)
Glucose: 95 mg/dL (ref 65–99)
Potassium: 4.5 mmol/L (ref 3.5–5.2)
Sodium: 136 mmol/L (ref 134–144)
Total Protein: 7.1 g/dL (ref 6.0–8.5)

## 2019-01-26 LAB — TSH+FREE T4
Free T4: 1.25 ng/dL (ref 0.82–1.77)
TSH: 0.438 u[IU]/mL — ABNORMAL LOW (ref 0.450–4.500)

## 2019-01-27 ENCOUNTER — Telehealth (INDEPENDENT_AMBULATORY_CARE_PROVIDER_SITE_OTHER): Payer: Self-pay

## 2019-01-27 NOTE — Telephone Encounter (Signed)
-----   Message from Kerin Perna, NP sent at 01/26/2019  8:46 AM EDT ----- I have reviewed all labs and they are normal

## 2019-01-27 NOTE — Telephone Encounter (Signed)
Patient returned call to [facility]. She is aware that all labs are normal. Nat Christen, CMA

## 2019-01-28 DIAGNOSIS — Z5181 Encounter for therapeutic drug level monitoring: Secondary | ICD-10-CM | POA: Diagnosis not present

## 2019-02-01 ENCOUNTER — Ambulatory Visit (INDEPENDENT_AMBULATORY_CARE_PROVIDER_SITE_OTHER): Payer: Medicaid Other | Admitting: Primary Care

## 2019-02-01 ENCOUNTER — Encounter (INDEPENDENT_AMBULATORY_CARE_PROVIDER_SITE_OTHER): Payer: Self-pay | Admitting: Primary Care

## 2019-02-01 ENCOUNTER — Other Ambulatory Visit: Payer: Self-pay

## 2019-02-01 VITALS — BP 105/68 | HR 98 | Temp 97.5°F | Ht 67.5 in | Wt 162.2 lb

## 2019-02-01 DIAGNOSIS — Z3042 Encounter for surveillance of injectable contraceptive: Secondary | ICD-10-CM | POA: Diagnosis not present

## 2019-02-01 DIAGNOSIS — Z3202 Encounter for pregnancy test, result negative: Secondary | ICD-10-CM

## 2019-02-01 DIAGNOSIS — Z3009 Encounter for other general counseling and advice on contraception: Secondary | ICD-10-CM

## 2019-02-01 LAB — POCT URINE PREGNANCY: Preg Test, Ur: NEGATIVE

## 2019-02-01 MED ORDER — MEDROXYPROGESTERONE ACETATE 150 MG/ML IM SUSY
150.0000 mg | PREFILLED_SYRINGE | Freq: Once | INTRAMUSCULAR | Status: AC
  Administered 2019-02-01: 16:00:00 150 mg via INTRAMUSCULAR

## 2019-02-01 NOTE — Patient Instructions (Signed)
Contraceptive Injection A contraceptive injection is a shot that prevents pregnancy. It is also called the birth control shot. The shot contains the hormone progestin, which prevents pregnancy by:  Stopping the ovaries from releasing eggs.  Thickening cervical mucus to prevent sperm from entering the cervix.  Thinning the lining of the uterus to prevent a fertilized egg from attaching to the uterus. Contraceptive injections are given under the skin (subcutaneous) or into a muscle (intramuscular). For these shots to work, you must get one of them every 3 months (12 weeks) from a health care provider. Tell a health care provider about:  Any allergies you have.  All medicines you are taking, including vitamins, herbs, eye drops, creams, and over-the-counter medicines.  Any blood disorders you have.  Any medical conditions you have.  Whether you are pregnant or may be pregnant. What are the risks? Generally, this is a safe procedure. However, problems may occur, including:  Mood changes or depression.  Loss of bone density (osteoporosis) after long-term use.  Blood clots.  Higher risk of an egg being fertilized outside your uterus (ectopic pregnancy).This is rare. What happens before the procedure?  Your health care provider may do a routine physical exam.  You may have a test to make sure you are not pregnant. What happens during the procedure?  The area where the shot will be given will be cleaned and sanitized with alcohol.  A needle will be inserted into a muscle in your upper arm or buttock, or into the skin of your thigh or abdomen. The needle will be attached to a syringe with the medicine inside of it.  The medicine will be pushed through the syringe and injected into your body.  A small bandage (dressing) may be placed over the injection site. What can I expect after the procedure?  After the procedure, it is common to have: ? Soreness around the injection site for  a couple of days. ? Irregular menstrual bleeding. ? Weight gain. ? Breast tenderness. ? Headaches. ? Discomfort in your abdomen.  Ask your health care provider whether you need to use an added method of birth control (backup contraception), such as a condom, sponge, or spermicide. ? If the first shot is given 1-7 days after the start of your last period, you will not need backup contraception. ? If the first shot is given at any other time during your menstrual cycle, you should avoid having sex or you will need backup contraception for 7 days after you receive the shot. Follow these instructions at home: General instructions   Take over-the-counter and prescription medicines only as told by your health care provider.  Do not massage the injection site.  Track your menstrual periods so you will know if they become irregular.  Always use a condom to protect against STIs (sexually transmitted infections).  Make sure you schedule an appointment in time for your next shot, and mark it on your calendar. For the birth control to prevent pregnancy, you must get the injections every 3 months (12 weeks). Lifestyle  Do not use any products that contain nicotine or tobacco, such as cigarettes and e-cigarettes. If you need help quitting, ask your health care provider.  Eat foods that are high in calcium and vitamin D, such as milk, cheese, and salmon. Doing this may help with any loss in bone density that is caused by the contraceptive injection. Ask your health care provider for dietary recommendations. Contact a health care provider if:  You  have nausea or vomiting.  You have abnormal vaginal discharge or bleeding.  You miss a period or you think you might be pregnant.  You experience mood changes or depression.  You feel dizzy or light-headed.  You have leg pain. Get help right away if:  You have chest pain.  You cough up blood.  You have shortness of breath.  You have a  severe headache that does not go away.  You have numbness in any part of your body.  You have slurred speech.  You have vision problems.  You have vaginal bleeding that is abnormally heavy or does not stop.  You have severe pain in your abdomen.  You have depression that does not get better with treatment. If you ever feel like you may hurt yourself or others, or have thoughts about taking your own life, get help right away. You can go to your nearest emergency department or call:  Your local emergency services (911 in the [place]  A suicide crisis helpline, such as the National Suicide Prevention Lifeline at 1-800-273-8255. This is open 24 hours a day. Summary  A contraceptive injection is a shot that prevents pregnancy. It is also called the birth control shot.  The shot is given under the skin (subcutaneous) or into a muscle (intramuscular).  After this procedure, it is common to have soreness around the injection site for a couple of days.  To prevent pregnancy, the shot must be given by a health care provider every 3 months (12 weeks).  After you have the shot, ask your health care provider whether you need to use an added method of birth control (backup contraception), such as a condom, sponge, or spermicide. This information is not intended to replace advice given to you by your health care provider. Make sure you discuss any questions you have with your health care provider. Document Released: 11/25/2016 Document Revised: 03/14/2017 Document Reviewed: 11/25/2016 Elsevier Patient Education  2020 [employer]  

## 2019-02-01 NOTE — Progress Notes (Signed)
Acute Office Visit  Subjective:    Patient ID: Andrea Wolfe, female    DOB: Dec 13, 1980, 38 y.o.   MRN: [id number]  Chief Complaint  Patient presents with  . depo injection    HPI Patient is in today for birth control management.   Past Medical History:  Diagnosis Date  . Medical history non-contributory     Past Surgical History:  Procedure Laterality Date  . FOOT SURGERY    . INDUCED ABORTION      Family History  Problem Relation Age of Onset  . Hypertension Mother   . Diabetes Mother     Social History   Socioeconomic History  . Marital status: Single    Spouse name: Not on file  . Number of children: Not on file  . Years of education: Not on file  . Highest education level: Not on file  Occupational History  . Not on file  Social Needs  . Financial resource strain: Not on file  . Food insecurity    Worry: Not on file    Inability: Not on file  . Transportation needs    Medical: Not on file    Non-medical: Not on file  Tobacco Use  . Smoking status: Current Some Day Smoker    Packs/day: 0.50    Types: Cigarettes  . Smokeless tobacco: Never Used  Substance and Sexual Activity  . Alcohol use: Yes  . Drug use: Yes    Comment: last smoked 07/11/14  . Sexual activity: Yes  Lifestyle  . Physical activity    Days per week: Not on file    Minutes per session: Not on file  . Stress: Not on file  Relationships  . Social Herbalist on phone: Not on file    Gets together: Not on file    Attends religious service: Not on file    Active member of club or organization: Not on file    Attends meetings of clubs or organizations: Not on file    Relationship status: Not on file  . Intimate partner violence    Fear of current or ex partner: Not on file    Emotionally abused: Not on file    Physically abused: Not on file    Forced sexual activity: Not on file  Other Topics Concern  . Not on file  Social History Narrative  . Not on file     No outpatient medications prior to visit.   No facility-administered medications prior to visit.     No Known Allergies  Review of Systems  All other systems reviewed and are negative.      Objective:    Physical Exam  Constitutional: She is oriented to person, place, and time. She appears well-developed.  Cardiovascular: Normal rate and regular rhythm.  Pulmonary/Chest: Effort normal and breath sounds normal.  Abdominal: Soft. Bowel sounds are normal.  Neurological: She is oriented to person, place, and time.  Psychiatric: She has a normal mood and affect.    BP 105/68 (BP Location: Left Arm, Patient Position: Sitting, Cuff Size: Normal)   Pulse 98   Temp (!) 97.5 F (36.4 C) (Temporal)   Ht 5' 7.5" (1.715 m)   Wt 162 lb 3.2 oz (73.6 kg)   SpO2 98%   BMI 25.03 kg/m  Wt Readings from Last 3 Encounters:  02/01/19 162 lb 3.2 oz (73.6 kg)  01/25/19 160 lb 3.2 oz (72.7 kg)  10/29/18 152 lb 3.2 oz (69  kg)    There are no preventive care reminders to display for this patient.  There are no preventive care reminders to display for this patient.   Lab Results  Component Value Date   TSH 0.438 (L) 01/25/2019   Lab Results  Component Value Date   WBC 8.2 01/25/2019   HGB 12.9 01/25/2019   HCT 38.8 01/25/2019   MCV 97 01/25/2019   PLT 312 01/25/2019   Lab Results  Component Value Date   NA 136 01/25/2019   K 4.5 01/25/2019   CO2 22 01/25/2019   GLUCOSE 95 01/25/2019   BUN 13 01/25/2019   CREATININE 0.90 01/25/2019   BILITOT <0.2 01/25/2019   ALKPHOS 48 01/25/2019   AST 19 01/25/2019   ALT 11 01/25/2019   PROT 7.1 01/25/2019   ALBUMIN 4.4 01/25/2019   CALCIUM 8.9 01/25/2019   ANIONGAP 3 (L) 11/26/2015   No results found for: CHOL No results found for: HDL No results found for: LDLCALC No results found for: TRIG No results found for: CHOLHDL Lab Results  Component Value Date   HGBA1C 5.7 (H) 05/29/2018       Assessment & Plan:   Problem  List Items Addressed This Visit    None    Visit Diagnoses    Encounter for Depo-Provera contraception    -  Primary   Relevant Medications   medroxyPROGESTERone Acetate SUSY 150 mg (Completed)   Other Relevant Orders   POCT urine pregnancy (Completed)       Meds ordered this encounter  Medications  . medroxyPROGESTERone Acetate SUSY 150 mg     Kerin Perna, NP

## 2019-02-02 ENCOUNTER — Other Ambulatory Visit: Payer: Self-pay

## 2019-02-02 ENCOUNTER — Encounter: Payer: Self-pay | Admitting: Podiatry

## 2019-02-02 ENCOUNTER — Ambulatory Visit (INDEPENDENT_AMBULATORY_CARE_PROVIDER_SITE_OTHER): Payer: Medicaid Other

## 2019-02-02 ENCOUNTER — Other Ambulatory Visit: Payer: Self-pay | Admitting: Podiatry

## 2019-02-02 ENCOUNTER — Ambulatory Visit (INDEPENDENT_AMBULATORY_CARE_PROVIDER_SITE_OTHER): Payer: Medicaid Other | Admitting: Podiatry

## 2019-02-02 VITALS — BP 114/75 | HR 71 | Resp 16

## 2019-02-02 DIAGNOSIS — M2041 Other hammer toe(s) (acquired), right foot: Secondary | ICD-10-CM | POA: Diagnosis not present

## 2019-02-02 DIAGNOSIS — D492 Neoplasm of unspecified behavior of bone, soft tissue, and skin: Secondary | ICD-10-CM

## 2019-02-02 DIAGNOSIS — M779 Enthesopathy, unspecified: Secondary | ICD-10-CM | POA: Diagnosis not present

## 2019-02-02 DIAGNOSIS — M21969 Unspecified acquired deformity of unspecified lower leg: Secondary | ICD-10-CM | POA: Diagnosis not present

## 2019-02-02 DIAGNOSIS — M2042 Other hammer toe(s) (acquired), left foot: Secondary | ICD-10-CM | POA: Diagnosis not present

## 2019-02-02 DIAGNOSIS — M2040 Other hammer toe(s) (acquired), unspecified foot: Secondary | ICD-10-CM

## 2019-02-04 DIAGNOSIS — Z5181 Encounter for therapeutic drug level monitoring: Secondary | ICD-10-CM | POA: Diagnosis not present

## 2019-02-10 NOTE — Progress Notes (Signed)
Subjective:   Patient ID: Andrea Wolfe, female   DOB: 38 y.o.   MRN: [id number]   HPI 38 year old female presents the office today for concerns of painful calluses to both of her feet on the bottoms.  She said that she had a history of surgery to both feet about 10 years ago.  She has been getting calluses submetatarsal 5 as well as plantar submetatarsal 2.  They hurt with pressure in shoes.  Denies any redness or drainage or any swelling.   Review of Systems  All other systems reviewed and are negative.  Past Medical History:  Diagnosis Date  . Medical history non-contributory     Past Surgical History:  Procedure Laterality Date  . FOOT SURGERY    . INDUCED ABORTION      No current outpatient medications on file.  No Known Allergies        Objective:  Physical Exam  General: AAO x3, NAD  Dermatological: Bilateral hyperkeratotic lesion submetatarsal 5 as well as right foot submetatarsal 2.  Upon debridement there is no ongoing ulceration, drainage or any clinical signs of infection noted today.  There is no open lesions.    There is scars from prior surgery which are well-healed.  Vascular: Dorsalis Pedis artery and Posterior Tibial artery pedal pulses are 2/4 bilateral with immedate capillary fill time. There is no pain with calf compression, swelling, warmth, erythema.   Neruologic: Grossly intact via light touch bilateral.  Musculoskeletal: Prominent metatarsal heads plantarly.  Muscular strength 5/5 in all groups tested bilateral.  Gait: Unassisted, Nonantalgic.       Assessment:   Bilateral hyperkeratotic lesions    Plan:  -Treatment options discussed including all alternatives, risks, and complications -Etiology of symptoms were discussed -X-rays were obtained and reviewed with the patient. No evidence of acute fracture or foreign body.  Status post follow-up with metatarsal surgery. -Debrided the hyperkeratotic lesions x3 without any complications or  bleeding.  Recommend moisturizer daily.  Dispensed accommodative inserts to help offload the areas as well.  Discussed not going barefoot and wearing memory foam or thicker slippers around the house as well to avoid pressure.  Return in about 3 months (around 05/05/2019).  Trula Slade DPM

## 2019-02-11 DIAGNOSIS — Z5181 Encounter for therapeutic drug level monitoring: Secondary | ICD-10-CM | POA: Diagnosis not present

## 2019-02-18 DIAGNOSIS — Z5181 Encounter for therapeutic drug level monitoring: Secondary | ICD-10-CM | POA: Diagnosis not present

## 2019-02-23 DIAGNOSIS — Z5181 Encounter for therapeutic drug level monitoring: Secondary | ICD-10-CM | POA: Diagnosis not present

## 2019-03-03 DIAGNOSIS — Z5181 Encounter for therapeutic drug level monitoring: Secondary | ICD-10-CM | POA: Diagnosis not present

## 2019-03-09 DIAGNOSIS — Z5181 Encounter for therapeutic drug level monitoring: Secondary | ICD-10-CM | POA: Diagnosis not present

## 2019-03-16 DIAGNOSIS — Z5181 Encounter for therapeutic drug level monitoring: Secondary | ICD-10-CM | POA: Diagnosis not present

## 2019-03-20 ENCOUNTER — Emergency Department (HOSPITAL_COMMUNITY)
Admission: EM | Admit: 2019-03-20 | Discharge: 2019-03-20 | Disposition: A | Discharge status: Home / Self Care | Payer: Medicaid Other | Attending: Emergency Medicine | Admitting: Emergency Medicine

## 2019-03-20 ENCOUNTER — Other Ambulatory Visit: Payer: Self-pay

## 2019-03-20 ENCOUNTER — Emergency Department (HOSPITAL_COMMUNITY): Payer: Medicaid Other

## 2019-03-20 ENCOUNTER — Encounter (HOSPITAL_COMMUNITY): Payer: Self-pay

## 2019-03-20 DIAGNOSIS — Z20828 Contact with and (suspected) exposure to other viral communicable diseases: Secondary | ICD-10-CM | POA: Insufficient documentation

## 2019-03-20 DIAGNOSIS — R062 Wheezing: Secondary | ICD-10-CM

## 2019-03-20 DIAGNOSIS — J40 Bronchitis, not specified as acute or chronic: Secondary | ICD-10-CM | POA: Insufficient documentation

## 2019-03-20 DIAGNOSIS — F17210 Nicotine dependence, cigarettes, uncomplicated: Secondary | ICD-10-CM | POA: Insufficient documentation

## 2019-03-20 DIAGNOSIS — R05 Cough: Secondary | ICD-10-CM | POA: Diagnosis not present

## 2019-03-20 LAB — SARS CORONAVIRUS 2 (TAT 6-24 HRS): SARS Coronavirus 2: NEGATIVE

## 2019-03-20 MED ORDER — PREDNISONE 10 MG (21) PO TBPK: ORAL_TABLET | Freq: Every day | ORAL | 0 refills | Status: DC

## 2019-03-20 MED ORDER — DOXYCYCLINE HYCLATE 100 MG PO CAPS: 100.0000 mg | ORAL_CAPSULE | Freq: Two times a day (BID) | ORAL | 0 refills | Status: DC

## 2019-03-20 MED ORDER — ALBUTEROL SULFATE HFA 108 (90 BASE) MCG/ACT IN AERS: 1.0000 | INHALATION_SPRAY | Freq: Four times a day (QID) | RESPIRATORY_TRACT | 0 refills | Status: DC | PRN

## 2019-03-20 NOTE — ED Notes (Signed)
Pt verbalizes understanding of DC instructions. Pt belongings returned and is ambulatory out of ED.  

## 2019-03-20 NOTE — Discharge Instructions (Addendum)
You were seen in the ER for cough and congestion in your chest.  X-ray was normal.  We tested you for COVID-19.  This test she will be back in the next 24 hours.  Somebody should call you for any positive results however I recommend you check MyChart account to confirm test results.  You should isolate until your test results come back and until your symptoms are improving.  There is no pneumonia on your chest x-ray however you are at risk for early infection due to your smoking history.  You also had wheezing on your lungs here.  We will treat you with doxycycline which is an antibiotic, prednisone and albuterol inhaler.  Use 2 puffs of albuterol inhaler every 4-6 hours for the next 3 to 5 days.  This should help with opening up your airways, wheezing, chest tightness and cough.  Treatment of your illness and symptoms for now will include self-isolation, monitoring of symptoms and supportive care with over-the-counter medicines.    Stay well-hydrated. Rest. You can use over the counter medications to help with symptoms: [phone] mg acetaminophen (tylenol) every 6 hours, around the clock to help with associated fevers, sore throat, headaches, generalized body aches and malaise.  Oxymetazoline (afrin) intranasal spray once daily for no more than 3 days to help with congestion, after 3 days you can switch to another over-the-counter nasal steroid spray such as fluticasone (flonase) Allergy medication (loratadine, cetirizine, etc) and phenylephrine (sudafed) help with nasal congestion, runny nose and postnasal drip.   Dextromethorphan (Delsym) to suppress dry cough. Frequent coughing is likely causing your chest wall pain Guaifenesin (Mucinex) to help expectorate mucus and cough Wash your hands often to prevent spread.  Stay hydrated with plenty of clear fluids Rest   Return to the ED if symptoms are worsening or severe, there is increased work of breathing, chest pain or shortness of breath with  exertion or activity, inability to tolerate fluids due to persistent vomiting despite nausea medicines, passing out, light headedness.  If your test results are POSITIVE, the following isolation requirements need to be met to return to work and resume essential activities: At least 10 days since symptom onset  72 hours of absence of fever without antifever medicine (ibuprofen, acetaminophen). A fever is temperature of 100.28F or greater. Improvement of respiratory symptoms  If your test is NEGATIVE, you may return to work and essential activities as long as your symptoms have improved and you do not have a fever for a total of 3 days.  Call your job and notify them that your test result was negative to see if they will allow you to return to work.     Infection Prevention Recommendations for Individuals Confirmed to have, or Being Evaluated for, or have symptoms of 2019 Novel Coronavirus (COVID-19) Infection Who Receive Care at Home  Individuals who are confirmed to have, or are being evaluated for, COVID-19 should follow the prevention steps below until a healthcare provider or local or state health department says they can return to normal activities.  Stay home except to get medical care You should restrict activities outside your home, except for getting medical care. Do not go to work, school, or public areas, and do not use public transportation or taxis.  Call ahead before visiting your doctor Before your medical appointment, call the healthcare provider and tell them that you have, or are being evaluated for, COVID-19 infection. This will help the healthcare providers office take steps to keep other people  from getting infected. Ask your healthcare provider to call the local or state health department.  Monitor your symptoms Seek prompt medical attention if your illness is worsening (e.g., difficulty breathing). Before going to your medical appointment, call the healthcare provider  and tell them that you have, or are being evaluated for, COVID-19 infection. Ask your healthcare provider to call the local or state health department.  Wear a facemask You should wear a facemask that covers your nose and mouth when you are in the same room with other people and when you visit a healthcare provider. People who live with or visit you should also wear a facemask while they are in the same room with you.  Separate yourself from other people in your home As much as possible, you should stay in a different room from other people in your home. Also, you should use a separate bathroom, if available.  Avoid sharing household items You should not share dishes, drinking glasses, cups, eating utensils, towels, bedding, or other items with other people in your home. After using these items, you should wash them thoroughly with soap and water.  Cover your coughs and sneezes Cover your mouth and nose with a tissue when you cough or sneeze, or you can cough or sneeze into your sleeve. Throw used tissues in a lined trash can, and immediately wash your hands with soap and water for at least 20 seconds or use an alcohol-based hand rub.  Wash your [facility] your hands often and thoroughly with soap and water for at least 20 seconds. You can use an alcohol-based hand sanitizer if soap and water are not available and if your hands are not visibly dirty. Avoid touching your eyes, nose, and mouth with unwashed hands.   Prevention Steps for Caregivers and Household Members of Individuals Confirmed to have, or Being Evaluated for, or have symptoms of 2019 Novel Coronavirus (COVID-19) Infection Being Cared for in the Home  If you live with, or provide care at home for, a person confirmed to have, or being evaluated for, COVID-19 infection please follow these guidelines to prevent infection:  Follow healthcare providers instructions Make sure that you understand and can help the patient follow any  healthcare provider instructions for all care.  Provide for the patients basic needs You should help the patient with basic needs in the home and provide support for getting groceries, prescriptions, and other personal needs.  Monitor the patients symptoms If they are getting sicker, call his or her medical provider and tell them that the patient has, or is being evaluated for, COVID-19 infection. This will help the healthcare providers office take steps to keep other people from getting infected. Ask the healthcare provider to call the local or state health department.  Limit the number of people who have contact with the patient If possible, have only one caregiver for the patient. Other household members should stay in another home or place of residence. If this is not possible, they should stay in another room, or be separated from the patient as much as possible. Use a separate bathroom, if available. Restrict visitors who do not have an essential need to be in the home.  Keep older adults, very young children, and other sick people away from the patient Keep older adults, very young children, and those who have compromised immune systems or chronic health conditions away from the patient. This includes people with chronic heart, lung, or kidney conditions, diabetes, and cancer.  Ensure good  ventilation Make sure that shared spaces in the home have good air flow, such as from an air conditioner or an opened window, weather permitting.  Wash your hands often Wash your hands often and thoroughly with soap and water for at least 20 seconds. You can use an alcohol based hand sanitizer if soap and water are not available and if your hands are not visibly dirty. Avoid touching your eyes, nose, and mouth with unwashed hands. Use disposable paper towels to dry your hands. If not available, use dedicated cloth towels and replace them when they become wet.  Wear a facemask and gloves Wear a  disposable facemask at all times in the room and gloves when you touch or have contact with the patients blood, body fluids, and/or secretions or excretions, such as sweat, saliva, sputum, nasal mucus, vomit, urine, or feces.  Ensure the mask fits over your nose and mouth tightly, and do not touch it during use. Throw out disposable facemasks and gloves after using them. Do not reuse. Wash your hands immediately after removing your facemask and gloves. If your personal clothing becomes contaminated, carefully remove clothing and launder. Wash your hands after handling contaminated clothing. Place all used disposable facemasks, gloves, and other waste in a lined container before disposing them with other household waste. Remove gloves and wash your hands immediately after handling these items.  Do not share dishes, glasses, or other household items with the patient Avoid sharing household items. You should not share dishes, drinking glasses, cups, eating utensils, towels, bedding, or other items with a patient who is confirmed to have, or being evaluated for, COVID-19 infection. After the person uses these items, you should wash them thoroughly with soap and water.  Wash laundry thoroughly Immediately remove and wash clothes or bedding that have blood, body fluids, and/or secretions or excretions, such as sweat, saliva, sputum, nasal mucus, vomit, urine, or feces, on them. Wear gloves when handling laundry from the patient. Read and follow directions on labels of laundry or clothing items and detergent. In general, wash and dry with the warmest temperatures recommended on the label.  Clean all areas the individual has used often Clean all touchable surfaces, such as counters, tabletops, doorknobs, bathroom fixtures, toilets, phones, keyboards, tablets, and bedside tables, every day. Also, clean any surfaces that may have blood, body fluids, and/or secretions or excretions on them. Wear gloves when  cleaning surfaces the patient has come in contact with. Use a diluted bleach solution (e.g., dilute bleach with 1 part bleach and 10 parts water) or a household disinfectant with a label that says EPA-registered for coronaviruses. To make a bleach solution at home, add 1 tablespoon of bleach to 1 quart (4 cups) of water. For a larger supply, add  cup of bleach to 1 gallon (16 cups) of water. Read labels of cleaning products and follow recommendations provided on product labels. Labels contain instructions for safe and effective use of the cleaning product including precautions you should take when applying the product, such as wearing gloves or eye protection and making sure you have good ventilation during use of the product. Remove gloves and wash hands immediately after cleaning.  Monitor yourself for signs and symptoms of illness Caregivers and household members are considered close contacts, should monitor their health, and will be asked to limit movement outside of the home to the extent possible. Follow the monitoring steps for close contacts listed on the symptom monitoring form.  ? If you have additional questions,  contact your local health department or call the epidemiologist on call at [phone] (available 24/7). ? This guidance is subject to change. For the most up-to-date guidance from [facility], please refer to their website: YouBlogs.pl

## 2019-03-20 NOTE — ED Triage Notes (Signed)
Pt c/o congestion in her chest. Pt states not relieved with nyquil or vicks vapor rub.

## 2019-03-20 NOTE — ED Provider Notes (Signed)
[facility] DEPT Provider Note   CSN: [id number] Arrival date & time: 03/20/19  1050     History   Chief Complaint Chief Complaint  Patient presents with  . URI    HPI Andrea Wolfe is a 38 y.o. female with history of tobacco use presents to the ER for evaluation of chest congestion for the last 6 days.  Reports productive cough with sputum production, chest tightness, wheezing and sternal sharp chest pain with coughing fits.  Has tried Mucinex and VapoRub with NyQuil without significant improvement.  States every time she tries to inhale to smoke a cigarette it will spark her cough.  Has not been able to smoke in the last 1 week.  Otherwise denies any other symptoms including fever, congestion, sore throat, exertional chest pain or shortness of breath, vomiting, diarrhea or abdominal pain.  No changes in taste or smell.  No sick contacts.  No travel.  Denies previous diagnosed history of lung disease like asthma or COPD.     HPI  Past Medical History:  Diagnosis Date  . Medical history non-contributory     There are no active problems to display for this patient.   Past Surgical History:  Procedure Laterality Date  . FOOT SURGERY    . INDUCED ABORTION       OB History    Gravida  4   Para  3   Term  3   Preterm      AB  1   Living  3     SAB      TAB  1   Ectopic      Multiple      Live Births  1            Home Medications    Prior to Admission medications   Medication Sig Start Date End Date Taking? Authorizing Provider  albuterol (VENTOLIN HFA) 108 (90 Base) MCG/ACT inhaler Inhale 1-2 puffs into the lungs every 6 (six) hours as needed for wheezing or shortness of breath. 03/20/19   Kinnie Feil, PA-C  doxycycline (VIBRAMYCIN) 100 MG capsule Take 1 capsule (100 mg total) by mouth 2 (two) times daily. 03/20/19   Kinnie Feil, PA-C  predniSONE (STERAPRED UNI-PAK 21 TAB) 10 MG (21) TBPK tablet Take by  mouth daily. Take 6 tabs by mouth daily  for 2 days, then 5 tabs for 2 days, then 4 tabs for 2 days, then 3 tabs for 2 days, 2 tabs for 2 days, then 1 tab by mouth daily for 2 days 03/20/19   Kinnie Feil, PA-C    Family History Family History  Problem Relation Age of Onset  . Hypertension Mother   . Diabetes Mother     Social History Social History   Tobacco Use  . Smoking status: Current Some Day Smoker    Packs/day: 0.50    Types: Cigarettes  . Smokeless tobacco: Never Used  Substance Use Topics  . Alcohol use: Yes  . Drug use: Yes    Comment: last smoked 07/11/14     Allergies   Patient has no known allergies.   Review of Systems Review of Systems  Respiratory: Positive for cough, chest tightness and wheezing.   Cardiovascular: Positive for chest pain (With coughing).  All other systems reviewed and are negative.    Physical Exam Updated Vital Signs BP 110/71   Pulse 71   Temp 98.3 F (36.8 C) (Oral)   Resp  16   Wt 77.6 kg   SpO2 97%   BMI 26.39 kg/m   Physical Exam Vitals signs and nursing note reviewed.  Constitutional:      General: She is not in acute distress.    Appearance: She is well-developed.     Comments: NAD.  HENT:     Head: Normocephalic and atraumatic.     Right Ear: External ear normal.     Left Ear: External ear normal.     Nose: Nose normal.  Eyes:     General: No scleral icterus.    Conjunctiva/sclera: Conjunctivae normal.  Neck:     Musculoskeletal: Normal range of motion and neck supple.  Cardiovascular:     Rate and Rhythm: Normal rate and regular rhythm.     Heart sounds: Normal heart sounds. No murmur.  Pulmonary:     Effort: Pulmonary effort is normal.     Breath sounds: Wheezing present.     Comments: Wet sounding cough during exam.  Normal work of breathing.  Faint expiratory wheezing in upper and middle lobes. Musculoskeletal: Normal range of motion.        General: No deformity.  Skin:    General: Skin is  warm and dry.     Capillary Refill: Capillary refill takes less than 2 seconds.  Neurological:     Mental Status: She is alert and oriented to person, place, and time.  Psychiatric:        Behavior: Behavior normal.        Thought Content: Thought content normal.        Judgment: Judgment normal.      ED Treatments / Results  Labs (all labs ordered are listed, but only abnormal results are displayed) Labs Reviewed  SARS CORONAVIRUS 2 (TAT 6-24 HRS)    EKG None  Radiology Dg Chest 2 View  Result Date: 03/20/2019 CLINICAL DATA:  Chest congestion, cough EXAM: CHEST - 2 VIEW COMPARISON:  None. FINDINGS: Lungs are clear.  No pleural effusion or pneumothorax. The heart is normal in size. Visualized osseous structures are within normal limits. IMPRESSION: Normal chest radiographs. Electronically Signed   By: Julian Hy M.D.   On: 03/20/2019 11:36    Procedures Procedures (including critical care time)  Medications Ordered in ED Medications - No data to display   Initial Impression / Assessment and Plan / ED Course  I have reviewed the triage vital signs and the nursing notes.  Pertinent labs & imaging results that were available during my care of the patient were reviewed by me and considered in my medical decision making (see chart for details).  Pt presents with with URI sxs, chest tightness, wheezing. H/o tobacco abuse.  On exam, pt is non toxic appearing with normal breathing effort. No fever, no tachypnea, no tachycardia, normal oxygen saturations. Only faint expiratory wheezing.   CXR initiated in triage reviewed personally, normal.  Likely viral URI vs LRI vs bronchitis.   Given history of tobacco use, cough, sputum, wheezing will treat with doxycycline, prednisone, albuterol and OTC symptomatic treatment.  COVID send out test pending at discharge.  Recommended isolating until COVID test result.  Return precautions given. Pt in agreement.   Final Clinical  Impressions(s) / ED Diagnoses   Final diagnoses:  Bronchitis  Wheezing    ED Discharge Orders         Ordered    doxycycline (VIBRAMYCIN) 100 MG capsule  2 times daily     03/20/19 1320  albuterol (VENTOLIN HFA) 108 (90 Base) MCG/ACT inhaler  Every 6 hours PRN     03/20/19 1320    predniSONE (STERAPRED UNI-PAK 21 TAB) 10 MG (21) TBPK tablet  Daily     03/20/19 1320           Kinnie Feil, [place] 03/20/19 1355    Gareth Morgan, MD 03/22/19 1313

## 2019-03-23 DIAGNOSIS — Z5181 Encounter for therapeutic drug level monitoring: Secondary | ICD-10-CM | POA: Diagnosis not present

## 2019-03-29 DIAGNOSIS — Z5181 Encounter for therapeutic drug level monitoring: Secondary | ICD-10-CM | POA: Diagnosis not present

## 2019-04-12 DIAGNOSIS — Z5181 Encounter for therapeutic drug level monitoring: Secondary | ICD-10-CM | POA: Diagnosis not present

## 2019-04-23 ENCOUNTER — Ambulatory Visit (INDEPENDENT_AMBULATORY_CARE_PROVIDER_SITE_OTHER): Payer: Medicaid Other | Admitting: Primary Care

## 2019-04-26 ENCOUNTER — Other Ambulatory Visit (HOSPITAL_COMMUNITY)
Admission: RE | Admit: 2019-04-26 | Discharge: 2019-04-26 | Disposition: A | Discharge status: Home / Self Care | Payer: Medicaid Other | Source: Ambulatory Visit | Attending: Primary Care | Admitting: Primary Care

## 2019-04-26 ENCOUNTER — Ambulatory Visit (INDEPENDENT_AMBULATORY_CARE_PROVIDER_SITE_OTHER): Payer: Medicaid Other | Admitting: Primary Care

## 2019-04-26 ENCOUNTER — Encounter (INDEPENDENT_AMBULATORY_CARE_PROVIDER_SITE_OTHER): Payer: Self-pay | Admitting: Primary Care

## 2019-04-26 ENCOUNTER — Other Ambulatory Visit: Payer: Self-pay

## 2019-04-26 VITALS — BP 122/84 | HR 72 | Temp 97.2°F | Ht 67.5 in | Wt 169.4 lb

## 2019-04-26 DIAGNOSIS — N898 Other specified noninflammatory disorders of vagina: Secondary | ICD-10-CM | POA: Insufficient documentation

## 2019-04-26 DIAGNOSIS — Z3042 Encounter for surveillance of injectable contraceptive: Secondary | ICD-10-CM | POA: Diagnosis not present

## 2019-04-26 DIAGNOSIS — Z114 Encounter for screening for human immunodeficiency virus [HIV]: Secondary | ICD-10-CM

## 2019-04-26 DIAGNOSIS — Z5181 Encounter for therapeutic drug level monitoring: Secondary | ICD-10-CM | POA: Diagnosis not present

## 2019-04-26 MED ORDER — MEDROXYPROGESTERONE ACETATE 150 MG/ML IM SUSY
150.0000 mg | PREFILLED_SYRINGE | Freq: Once | INTRAMUSCULAR | Status: AC
  Administered 2019-04-26: 150 mg via INTRAMUSCULAR

## 2019-04-26 NOTE — Progress Notes (Signed)
Established Patient Office Visit  Subjective:  Patient ID: Andrea Wolfe, female    DOB: 05/04/1980  Age: 39 y.o. MRN: [id number]  CC:  Chief Complaint  Patient presents with  . depo injection     HPI Andrea Wolfe presents for management of conception. She voiced concern about a "odor and spotting ".  Past Medical History:  Diagnosis Date  . Medical history non-contributory     Past Surgical History:  Procedure Laterality Date  . FOOT SURGERY    . INDUCED ABORTION      Family History  Problem Relation Age of Onset  . Hypertension Mother   . Diabetes Mother     Social History   Socioeconomic History  . Marital status: Single    Spouse name: Not on file  . Number of children: Not on file  . Years of education: Not on file  . Highest education level: Not on file  Occupational History  . Not on file  Tobacco Use  . Smoking status: Current Some Day Smoker    Packs/day: 0.50    Types: Cigarettes  . Smokeless tobacco: Never Used  Substance and Sexual Activity  . Alcohol use: Yes  . Drug use: Yes    Comment: last smoked 07/11/14  . Sexual activity: Yes  Other Topics Concern  . Not on file  Social History Narrative  . Not on file   Social Determinants of Health   Financial Resource Strain:   . Difficulty of Paying Living Expenses: Not on file  Food Insecurity:   . Worried About Charity fundraiser in the Last Year: Not on file  . Ran Out of Food in the Last Year: Not on file  Transportation Needs:   . Lack of Transportation (Medical): Not on file  . Lack of Transportation (Non-Medical): Not on file  Physical Activity:   . Days of Exercise per Week: Not on file  . Minutes of Exercise per Session: Not on file  Stress:   . Feeling of Stress : Not on file  Social Connections:   . Frequency of Communication with Friends and Family: Not on file  . Frequency of Social Gatherings with Friends and Family: Not on file  . Attends Religious Services: Not on  file  . Active Member of Clubs or Organizations: Not on file  . Attends Archivist Meetings: Not on file  . Marital Status: Not on file  Intimate Partner Violence:   . Fear of Current or Ex-Partner: Not on file  . Emotionally Abused: Not on file  . Physically Abused: Not on file  . Sexually Abused: Not on file    Outpatient Medications Prior to Visit  Medication Sig Dispense Refill  . albuterol (VENTOLIN HFA) 108 (90 Base) MCG/ACT inhaler Inhale 1-2 puffs into the lungs every 6 (six) hours as needed for wheezing or shortness of breath. 8 g 0  . doxycycline (VIBRAMYCIN) 100 MG capsule Take 1 capsule (100 mg total) by mouth 2 (two) times daily. 20 capsule 0  . predniSONE (STERAPRED UNI-PAK 21 TAB) 10 MG (21) TBPK tablet Take by mouth daily. Take 6 tabs by mouth daily  for 2 days, then 5 tabs for 2 days, then 4 tabs for 2 days, then 3 tabs for 2 days, 2 tabs for 2 days, then 1 tab by mouth daily for 2 days 42 tablet 0   No facility-administered medications prior to visit.    No Known Allergies  ROS Review  of Systems  Genitourinary: Positive for menstrual problem.       Spotting due to depo   Vaginal odor       Objective:    Physical Exam  Constitutional: She is oriented to person, place, and time. She appears well-developed and well-nourished.  Cardiovascular: Normal rate and regular rhythm.  Pulmonary/Chest: Effort normal and breath sounds normal.  Abdominal: Bowel sounds are normal.  Musculoskeletal:        General: Normal range of motion.     Cervical back: Normal range of motion.  Neurological: She is oriented to person, place, and time.  Skin: Skin is warm and dry.  Psychiatric: She has a normal mood and affect. Her behavior is normal.    BP 122/84 (BP Location: Right Arm, Patient Position: Sitting, Cuff Size: Normal)   Pulse 72   Temp (!) 97.2 F (36.2 C) (Temporal)   Ht 5' 7.5" (1.715 m)   Wt 169 lb 6.4 oz (76.8 kg)   SpO2 99%   BMI 26.14 kg/m  Wt  Readings from Last 3 Encounters:  04/26/19 169 lb 6.4 oz (76.8 kg)  03/20/19 171 lb (77.6 kg)  02/01/19 162 lb 3.2 oz (73.6 kg)     There are no preventive care reminders to display for this patient.  There are no preventive care reminders to display for this patient.  Lab Results  Component Value Date   TSH 0.438 (L) 01/25/2019   Lab Results  Component Value Date   WBC 8.2 01/25/2019   HGB 12.9 01/25/2019   HCT 38.8 01/25/2019   MCV 97 01/25/2019   PLT 312 01/25/2019   Lab Results  Component Value Date   NA 136 01/25/2019   K 4.5 01/25/2019   CO2 22 01/25/2019   GLUCOSE 95 01/25/2019   BUN 13 01/25/2019   CREATININE 0.90 01/25/2019   BILITOT <0.2 01/25/2019   ALKPHOS 48 01/25/2019   AST 19 01/25/2019   ALT 11 01/25/2019   PROT 7.1 01/25/2019   ALBUMIN 4.4 01/25/2019   CALCIUM 8.9 01/25/2019   ANIONGAP 3 (L) 11/26/2015   No results found for: CHOL No results found for: HDL No results found for: LDLCALC No results found for: TRIG No results found for: CHOLHDL Lab Results  Component Value Date   HGBA1C 5.7 (H) 05/29/2018      Assessment & Plan:  Andrea Wolfe was seen today for depo injection.  Diagnoses and all orders for this visit:  Encounter for Depo-Provera contraception Intermittent menstrual spotting is normal with this birth control -     medroxyPROGESTERone Acetate SUSY 150 mg  Vaginal odor With discharge states feels uncomfortable to have sex and would like to be retested to make sure everything is alright    Meds ordered this encounter  Medications  . medroxyPROGESTERone Acetate SUSY 150 mg    Follow-up: Return for [employer]- march 29- april 12 for next depo .    Andrea Perna, NP

## 2019-04-29 ENCOUNTER — Other Ambulatory Visit (INDEPENDENT_AMBULATORY_CARE_PROVIDER_SITE_OTHER): Payer: Self-pay | Admitting: Primary Care

## 2019-04-29 LAB — CERVICOVAGINAL ANCILLARY ONLY
Bacterial Vaginitis (gardnerella): NEGATIVE
Candida Glabrata: NEGATIVE
Candida Vaginitis: POSITIVE — AB
Chlamydia: NEGATIVE
Comment: NEGATIVE
Comment: NEGATIVE
Comment: NEGATIVE
Comment: NEGATIVE
Comment: NEGATIVE
Comment: NORMAL
Neisseria Gonorrhea: NEGATIVE
Trichomonas: NEGATIVE

## 2019-04-29 MED ORDER — FLUCONAZOLE 150 MG PO TABS: 150.0000 mg | ORAL_TABLET | Freq: Once | ORAL | 0 refills | Status: AC

## 2019-05-03 ENCOUNTER — Other Ambulatory Visit (INDEPENDENT_AMBULATORY_CARE_PROVIDER_SITE_OTHER): Payer: Self-pay | Admitting: Primary Care

## 2019-05-03 DIAGNOSIS — Z5181 Encounter for therapeutic drug level monitoring: Secondary | ICD-10-CM | POA: Diagnosis not present

## 2019-05-03 MED ORDER — FLUCONAZOLE 150 MG PO TABS: 150.0000 mg | ORAL_TABLET | Freq: Once | ORAL | 0 refills | Status: AC

## 2019-05-05 ENCOUNTER — Other Ambulatory Visit (INDEPENDENT_AMBULATORY_CARE_PROVIDER_SITE_OTHER): Payer: Self-pay | Admitting: Primary Care

## 2019-05-05 MED ORDER — FLUCONAZOLE 150 MG PO TABS: 150.0000 mg | ORAL_TABLET | Freq: Once | ORAL | 0 refills | Status: AC

## 2019-05-06 ENCOUNTER — Ambulatory Visit: Payer: Medicaid Other | Admitting: Podiatry

## 2019-05-06 ENCOUNTER — Telehealth (INDEPENDENT_AMBULATORY_CARE_PROVIDER_SITE_OTHER): Payer: Self-pay

## 2019-05-06 NOTE — Telephone Encounter (Signed)
Patient is aware of positive candida. Sent staff message to PCP to send Rx to pharmacy. Informed patient that once Rx was sent, would call and inform her. Nat Christen, CMA

## 2019-05-06 NOTE — Telephone Encounter (Signed)
-----   Message from Kerin Perna, NP sent at 04/29/2019  5:20 PM EST ----- [facility] sent in diflucan 150

## 2019-05-07 ENCOUNTER — Other Ambulatory Visit: Payer: Self-pay | Admitting: Pharmacist

## 2019-05-07 MED ORDER — FLUCONAZOLE 150 MG PO TABS: ORAL_TABLET | ORAL | 0 refills | Status: DC

## 2019-05-10 DIAGNOSIS — Z5181 Encounter for therapeutic drug level monitoring: Secondary | ICD-10-CM | POA: Diagnosis not present

## 2019-05-17 DIAGNOSIS — Z5181 Encounter for therapeutic drug level monitoring: Secondary | ICD-10-CM | POA: Diagnosis not present

## 2019-05-24 DIAGNOSIS — Z5181 Encounter for therapeutic drug level monitoring: Secondary | ICD-10-CM | POA: Diagnosis not present

## 2019-05-31 DIAGNOSIS — Z5181 Encounter for therapeutic drug level monitoring: Secondary | ICD-10-CM | POA: Diagnosis not present

## 2019-06-01 ENCOUNTER — Ambulatory Visit: Payer: Medicaid Other | Admitting: Podiatry

## 2019-06-01 ENCOUNTER — Ambulatory Visit (INDEPENDENT_AMBULATORY_CARE_PROVIDER_SITE_OTHER): Payer: Medicaid Other

## 2019-06-01 ENCOUNTER — Ambulatory Visit: Payer: Medicaid Other

## 2019-06-01 ENCOUNTER — Other Ambulatory Visit: Payer: Self-pay

## 2019-06-01 DIAGNOSIS — M216X1 Other acquired deformities of right foot: Secondary | ICD-10-CM | POA: Diagnosis not present

## 2019-06-01 DIAGNOSIS — M21619 Bunion of unspecified foot: Secondary | ICD-10-CM

## 2019-06-01 DIAGNOSIS — M21969 Unspecified acquired deformity of unspecified lower leg: Secondary | ICD-10-CM

## 2019-06-01 DIAGNOSIS — M216X2 Other acquired deformities of left foot: Secondary | ICD-10-CM | POA: Diagnosis not present

## 2019-06-07 NOTE — Progress Notes (Signed)
Subjective: 39 year old female presents the office today for painful calluses both of her feet which is been ongoing for quite some time.  She states that she wants to consider surgery as well as other treatment options for the painful calluses as they get thick causing quite a bit of discomfort. Denies any systemic complaints such as fevers, chills, nausea, vomiting. No acute changes since last appointment, and no other complaints at this time.   Objective: AAO x3, NAD DP/PT pulses palpable bilaterally, CRT less than 3 seconds On the left foot there is hyperkeratotic tissue submetatarsal 2 as well as to the fourth interspace.  Hyperkeratotic tissue on the right and left foot submetatarsal area as well to submetatarsal 5.  Upon debridement no ongoing ulceration drainage or signs of infection. Scar from prior surgery left foot is well-healed.  Bunion deformity present No open lesions or pre-ulcerative lesions.  No pain with calf compression, swelling, warmth, erythema  Assessment: Bilateral symptomatic hyperkeratotic lesions  Plan: -All treatment options discussed with the patient including all alternatives, risks, complications.  -X-rays obtained and reviewed.  Bunion deformities present as well as hardware from prior surgery in the left foot.  There is no evidence of acute fracture or foreign body. -Debrided the hyperkeratotic lesions without any complications or bleeding.  Discussed moisturizer daily and I dispensed miracle foot cream.  Continue offloading discussion modifications.  We also discussed surgical intervention.  Discussed with her shaving of the fifth metatarsal head versus fifth metatarsal excision.  Also given the callus metatarsal two will need second tarsal osteotomy but likely bunion correction as well in the left foot. -Patient encouraged to call the office with any questions, concerns, change in symptoms.   Trula Slade DPM

## 2019-06-11 ENCOUNTER — Other Ambulatory Visit: Payer: Self-pay

## 2019-06-11 ENCOUNTER — Other Ambulatory Visit: Payer: Medicaid Other | Admitting: Orthotics

## 2019-07-12 ENCOUNTER — Ambulatory Visit (INDEPENDENT_AMBULATORY_CARE_PROVIDER_SITE_OTHER): Payer: Medicaid Other

## 2020-01-18 ENCOUNTER — Encounter (INDEPENDENT_AMBULATORY_CARE_PROVIDER_SITE_OTHER): Payer: Self-pay | Admitting: Primary Care

## 2020-01-18 ENCOUNTER — Other Ambulatory Visit: Payer: Self-pay

## 2020-01-18 ENCOUNTER — Ambulatory Visit (INDEPENDENT_AMBULATORY_CARE_PROVIDER_SITE_OTHER): Payer: Medicaid Other | Admitting: Primary Care

## 2020-01-18 VITALS — BP 119/77 | HR 73 | Temp 97.5°F | Ht 67.5 in | Wt 183.6 lb

## 2020-01-18 DIAGNOSIS — Z3042 Encounter for surveillance of injectable contraceptive: Secondary | ICD-10-CM

## 2020-01-18 LAB — POCT URINE PREGNANCY: Preg Test, Ur: NEGATIVE

## 2020-01-18 MED ORDER — MEDROXYPROGESTERONE ACETATE 150 MG/ML IM SUSY
150.0000 mg | PREFILLED_SYRINGE | Freq: Once | INTRAMUSCULAR | Status: AC
  Administered 2020-01-18: 150 mg via INTRAMUSCULAR

## 2020-01-18 NOTE — Progress Notes (Signed)
Established Patient Office Visit  Subjective:  Patient ID: Andrea Wolfe, female    DOB: 1981-03-25  Age: 39 y.o. MRN: [id number]  CC:  Chief Complaint  Patient presents with  . Contraception    HPI Ms. Andrea Wolfe is a 39 year old female who presents for contraception management.  However has Depo injection was given January 2021.  She will get a pregnancy test to determine if she is eligible for Depo. Past Medical History:  Diagnosis Date  . Medical history non-contributory     Past Surgical History:  Procedure Laterality Date  . FOOT SURGERY    . INDUCED ABORTION      Family History  Problem Relation Age of Onset  . Hypertension Mother   . Diabetes Mother     Social History   Socioeconomic History  . Marital status: Single    Spouse name: Not on file  . Number of children: Not on file  . Years of education: Not on file  . Highest education level: Not on file  Occupational History  . Not on file  Tobacco Use  . Smoking status: Current Some Day Smoker    Packs/day: 0.50    Types: Cigarettes  . Smokeless tobacco: Never Used  Substance and Sexual Activity  . Alcohol use: Yes  . Drug use: Yes    Comment: last smoked 07/11/14  . Sexual activity: Yes  Other Topics Concern  . Not on file  Social History Narrative  . Not on file   Social Determinants of Health   Financial Resource Strain:   . Difficulty of Paying Living Expenses: Not on file  Food Insecurity:   . Worried About Charity fundraiser in the Last Year: Not on file  . Ran Out of Food in the Last Year: Not on file  Transportation Needs:   . Lack of Transportation (Medical): Not on file  . Lack of Transportation (Non-Medical): Not on file  Physical Activity:   . Days of Exercise per Week: Not on file  . Minutes of Exercise per Session: Not on file  Stress:   . Feeling of Stress : Not on file  Social Connections:   . Frequency of Communication with Friends and Family: Not on file  .  Frequency of Social Gatherings with Friends and Family: Not on file  . Attends Religious Services: Not on file  . Active Member of Clubs or Organizations: Not on file  . Attends Archivist Meetings: Not on file  . Marital Status: Not on file  Intimate Partner Violence:   . Fear of Current or Ex-Partner: Not on file  . Emotionally Abused: Not on file  . Physically Abused: Not on file  . Sexually Abused: Not on file    Outpatient Medications Prior to Visit  Medication Sig Dispense Refill  . albuterol (VENTOLIN HFA) 108 (90 Base) MCG/ACT inhaler Inhale 1-2 puffs into the lungs every 6 (six) hours as needed for wheezing or shortness of breath. 8 g 0  . fluconazole (DIFLUCAN) 150 MG tablet Take 1 tablet by mouth once. May repeat in 2-3 days if symptoms persist. 2 tablet 0   No facility-administered medications prior to visit.    No Known Allergies  ROS Review of Systems  All other systems reviewed and are negative.     Objective:    Physical Exam Vitals reviewed.  HENT:     Head: Normocephalic.  Eyes:     Extraocular Movements: Extraocular movements intact.  Cardiovascular:     Rate and Rhythm: Normal rate and regular rhythm.     Pulses: Normal pulses.     Heart sounds: Normal heart sounds.  Pulmonary:     Effort: Pulmonary effort is normal.     Breath sounds: Normal breath sounds.  Abdominal:     General: Bowel sounds are normal. There is distension.  Musculoskeletal:        General: Normal range of motion.     Cervical back: Normal range of motion.  Skin:    General: Skin is warm and dry.  Neurological:     Mental Status: She is alert and oriented to person, place, and time.  Psychiatric:        Mood and Affect: Mood normal.        Behavior: Behavior normal.        Thought Content: Thought content normal.        Judgment: Judgment normal.     BP 119/77 (BP Location: Right Arm, Patient Position: Sitting, Cuff Size: Normal)   Pulse 73   Temp (!) 97.5  F (36.4 C) (Temporal)   Ht 5' 7.5" (1.715 m)   Wt 183 lb 9.6 oz (83.3 kg)   LMP 12/21/2019 (Approximate)   SpO2 100%   BMI 28.33 kg/m  Wt Readings from Last 3 Encounters:  01/18/20 183 lb 9.6 oz (83.3 kg)  04/26/19 169 lb 6.4 oz (76.8 kg)  03/20/19 171 lb (77.6 kg)     Health Maintenance Due  Topic Date Due  . Hepatitis C Screening  Never done  . INFLUENZA VACCINE  11/14/2019  . COVID-19 Vaccine (2 - Pfizer 2-dose series) 01/21/2020    There are no preventive care reminders to display for this patient.  Lab Results  Component Value Date   TSH 0.438 (L) 01/25/2019   Lab Results  Component Value Date   WBC 8.2 01/25/2019   HGB 12.9 01/25/2019   HCT 38.8 01/25/2019   MCV 97 01/25/2019   PLT 312 01/25/2019   Lab Results  Component Value Date   NA 136 01/25/2019   K 4.5 01/25/2019   CO2 22 01/25/2019   GLUCOSE 95 01/25/2019   BUN 13 01/25/2019   CREATININE 0.90 01/25/2019   BILITOT <0.2 01/25/2019   ALKPHOS 48 01/25/2019   AST 19 01/25/2019   ALT 11 01/25/2019   PROT 7.1 01/25/2019   ALBUMIN 4.4 01/25/2019   CALCIUM 8.9 01/25/2019   ANIONGAP 3 (L) 11/26/2015   No results found for: CHOL No results found for: HDL No results found for: LDLCALC No results found for: TRIG No results found for: CHOLHDL Lab Results  Component Value Date   HGBA1C 5.7 (H) 05/29/2018      Assessment & Plan:  Andrea Wolfe was seen today for contraception.  Diagnoses and all orders for this visit:  Encounter for Depo-Provera contraception    She understands that Depo-Provera is a progesterone only therapy, and that patients often patients have irregular and unpredictable vaginal bleeding or amenorrhea. She understands that other side effects are possible related to systemic progesterone, including but not limited to, headaches, breast tenderness, nausea, and irritability. While effective at preventing pregnancy Depo-Provera does not prevent transmission of sexually transmitted  diseases and use of barrier methods for this purpose was discussed.  She is aware of the need to return every 3 months for re-dosing.  We also discussed the FDA black box warning regarding long term use (greater than 2 years) and bone loss.  -  POCT urine pregnancy   Meds ordered this encounter  Medications  . medroxyPROGESTERone Acetate SUSY 150 mg    Follow-up: Return vaginal ordor, for next depo inection Dec 21- Jan 4.    Kerin Perna, NP

## 2020-01-25 ENCOUNTER — Ambulatory Visit (INDEPENDENT_AMBULATORY_CARE_PROVIDER_SITE_OTHER): Payer: Medicaid Other | Admitting: Primary Care

## 2020-02-01 ENCOUNTER — Encounter (INDEPENDENT_AMBULATORY_CARE_PROVIDER_SITE_OTHER): Payer: Self-pay | Admitting: Primary Care

## 2020-02-01 ENCOUNTER — Other Ambulatory Visit (HOSPITAL_COMMUNITY)
Admission: RE | Admit: 2020-02-01 | Discharge: 2020-02-01 | Disposition: A | Discharge status: Home / Self Care | Payer: Medicaid Other | Source: Ambulatory Visit | Attending: Primary Care | Admitting: Primary Care

## 2020-02-01 ENCOUNTER — Other Ambulatory Visit: Payer: Self-pay

## 2020-02-01 ENCOUNTER — Ambulatory Visit (INDEPENDENT_AMBULATORY_CARE_PROVIDER_SITE_OTHER): Payer: Medicaid Other | Admitting: Primary Care

## 2020-02-01 DIAGNOSIS — R300 Dysuria: Secondary | ICD-10-CM | POA: Diagnosis not present

## 2020-02-01 DIAGNOSIS — N898 Other specified noninflammatory disorders of vagina: Secondary | ICD-10-CM

## 2020-02-01 LAB — POCT URINALYSIS DIP (CLINITEK)
Bilirubin, UA: NEGATIVE
Glucose, UA: NEGATIVE mg/dL
Leukocytes, UA: NEGATIVE
Nitrite, UA: NEGATIVE
POC PROTEIN,UA: NEGATIVE
Spec Grav, UA: >=1.03 — AB (ref 1.010–1.025)
Urobilinogen, UA: 0.2 E.U./dL
pH, UA: 5.5 (ref 5.0–8.0)

## 2020-02-01 NOTE — Progress Notes (Signed)
Established Patient Office Visit  Subjective:  Patient ID: Andrea Wolfe, female    DOB: [birth year]  Age: 39 y.o. MRN: [id number]  CC:  Chief Complaint  Patient presents with  . Dysuria  . Vaginal Discharge    with odor     HPI  Andrea Wolfe is a 39 y.o. [phone] who LMP was No LMP recorded. Patient has had an injection., presents today for a problem visit.   Patient complains of an abnormal vaginal discharge for 3 month. Discharge described as: copious, white and malodorous. Vaginal symptoms include burning and discharge described as malodorous and frothy.Vulvar symptoms include local irritation and odor.STI Risk: Possible STD exposure.   Other associated symptoms: none.Menstrual pattern: She had been bleeding none. Contraception: Depo-Provera injections.     Past Medical History:  Diagnosis Date  . Medical history non-contributory     Past Surgical History:  Procedure Laterality Date  . FOOT SURGERY    . INDUCED ABORTION      Family History  Problem Relation Age of Onset  . Hypertension Mother   . Diabetes Mother     Social History   Socioeconomic History  . Marital status: Single    Spouse name: Not on file  . Number of children: Not on file  . Years of education: Not on file  . Highest education level: Not on file  Occupational History  . Not on file  Tobacco Use  . Smoking status: Current Some Day Smoker    Packs/day: 0.50    Types: Cigarettes  . Smokeless tobacco: Never Used  Substance and Sexual Activity  . Alcohol use: Yes  . Drug use: Yes    Comment: last smoked 07/11/14  . Sexual activity: Yes  Other Topics Concern  . Not on file  Social History Narrative  . Not on file   Social Determinants of Health   Financial Resource Strain:   . Difficulty of Paying Living Expenses: Not on file  Food Insecurity:   . Worried About Charity fundraiser in the Last Year: Not on file  . Ran Out of Food in the Last Year: Not on file  Transportation  Needs:   . Lack of Transportation (Medical): Not on file  . Lack of Transportation (Non-Medical): Not on file  Physical Activity:   . Days of Exercise per Week: Not on file  . Minutes of Exercise per Session: Not on file  Stress:   . Feeling of Stress : Not on file  Social Connections:   . Frequency of Communication with Friends and Family: Not on file  . Frequency of Social Gatherings with Friends and Family: Not on file  . Attends Religious Services: Not on file  . Active Member of Clubs or Organizations: Not on file  . Attends Archivist Meetings: Not on file  . Marital Status: Not on file  Intimate Partner Violence:   . Fear of Current or Ex-Partner: Not on file  . Emotionally Abused: Not on file  . Physically Abused: Not on file  . Sexually Abused: Not on file    Outpatient Medications Prior to Visit  Medication Sig Dispense Refill  . albuterol (VENTOLIN HFA) 108 (90 Base) MCG/ACT inhaler Inhale 1-2 puffs into the lungs every 6 (six) hours as needed for wheezing or shortness of breath. 8 g 0  . amoxicillin (AMOXIL) 500 MG tablet Take 500 mg by mouth 4 (four) times daily.     No facility-administered medications prior  to visit.    No Known Allergies  ROS Review of Systems  Genitourinary: Positive for vaginal discharge and vaginal pain.  All other systems reviewed and are negative.     Objective:    Physical Exam Vitals reviewed.  HENT:     Head: Normocephalic.     Nose: Nose normal.  Cardiovascular:     Rate and Rhythm: Normal rate and regular rhythm.  Pulmonary:     Effort: Pulmonary effort is normal.     Breath sounds: Normal breath sounds.  Abdominal:     General: Bowel sounds are normal. There is distension.  Musculoskeletal:        General: Normal range of motion.     Cervical back: Normal range of motion.  Skin:    General: Skin is dry.  Neurological:     Mental Status: She is alert.  Psychiatric:        Mood and Affect: Mood normal.         Behavior: Behavior normal.        Thought Content: Thought content normal.        Judgment: Judgment normal.     BP 107/60 (BP Location: Right Arm, Patient Position: Sitting, Cuff Size: Large)   Pulse 96   Temp (!) 97.5 F (36.4 C) (Temporal)   Ht 5' 7.5" (1.715 m)   Wt 183 lb 9.6 oz (83.3 kg)   SpO2 97%   BMI 28.33 kg/m  Wt Readings from Last 3 Encounters:  02/01/20 183 lb 9.6 oz (83.3 kg)  01/18/20 183 lb 9.6 oz (83.3 kg)  04/26/19 169 lb 6.4 oz (76.8 kg)     Health Maintenance Due  Topic Date Due  . Hepatitis C Screening  Never done    There are no preventive care reminders to display for this patient.  Lab Results  Component Value Date   TSH 0.438 (L) 01/25/2019   Lab Results  Component Value Date   WBC 8.2 01/25/2019   HGB 12.9 01/25/2019   HCT 38.8 01/25/2019   MCV 97 01/25/2019   PLT 312 01/25/2019   Lab Results  Component Value Date   NA 136 01/25/2019   K 4.5 01/25/2019   CO2 22 01/25/2019   GLUCOSE 95 01/25/2019   BUN 13 01/25/2019   CREATININE 0.90 01/25/2019   BILITOT <0.2 01/25/2019   ALKPHOS 48 01/25/2019   AST 19 01/25/2019   ALT 11 01/25/2019   PROT 7.1 01/25/2019   ALBUMIN 4.4 01/25/2019   CALCIUM 8.9 01/25/2019   ANIONGAP 3 (L) 11/26/2015   No results found for: CHOL No results found for: HDL No results found for: LDLCALC No results found for: TRIG No results found for: CHOLHDL Lab Results  Component Value Date   HGBA1C 5.7 (H) 05/29/2018      Assessment & Plan:   [place] was seen today for dysuria and vaginal discharge.  Diagnoses and all orders for this visit: [place] was seen today for dysuria and vaginal discharge.  Diagnoses and all orders for this visit:  Vaginal odor -     POCT URINALYSIS DIP (CLINITEK) -     Cervicovaginal ancillary only  Vaginal discharge -     POCT URINALYSIS DIP (CLINITEK) -     Cervicovaginal ancillary only  Dysuria -     POCT URINALYSIS DIP (CLINITEK)     Follow-up: Return  keep scheduled appointment.    Kerin Perna, NP

## 2020-02-01 NOTE — Patient Instructions (Signed)

## 2020-02-02 ENCOUNTER — Other Ambulatory Visit (INDEPENDENT_AMBULATORY_CARE_PROVIDER_SITE_OTHER): Payer: Self-pay | Admitting: Primary Care

## 2020-02-02 DIAGNOSIS — N760 Acute vaginitis: Secondary | ICD-10-CM

## 2020-02-02 DIAGNOSIS — B9689 Other specified bacterial agents as the cause of diseases classified elsewhere: Secondary | ICD-10-CM

## 2020-02-02 LAB — CERVICOVAGINAL ANCILLARY ONLY
Bacterial Vaginitis (gardnerella): POSITIVE — AB
Candida Glabrata: NEGATIVE
Candida Vaginitis: NEGATIVE
Chlamydia: NEGATIVE
Comment: NEGATIVE
Comment: NEGATIVE
Comment: NEGATIVE
Comment: NEGATIVE
Comment: NEGATIVE
Comment: NORMAL
Neisseria Gonorrhea: NEGATIVE
Trichomonas: NEGATIVE

## 2020-02-02 MED ORDER — METRONIDAZOLE 500 MG PO TABS: 500.0000 mg | ORAL_TABLET | Freq: Two times a day (BID) | ORAL | 0 refills | Status: DC

## 2020-02-08 ENCOUNTER — Telehealth (INDEPENDENT_AMBULATORY_CARE_PROVIDER_SITE_OTHER): Payer: Self-pay

## 2020-02-08 NOTE — Telephone Encounter (Signed)
-----   Message from Kerin Perna, NP sent at 02/02/2020 10:04 PM EDT ----- You have a diagnosis of bacterial vaginosis. A prescription for metronidazole. You take this medication twice a day for 7 days. Be sure that you do not drink alcohol when you take this medication because the combination can give you severe nausea and vomiting.

## 2020-02-08 NOTE — Telephone Encounter (Signed)
Patient is aware that she is positive for BV. Medication sent to pharmacy to treat. Advised patient to not drink alcohol while taking medication as it will cause severe nausea and vomiting. She verbalized understanding. Nat Christen, CMA

## 2020-04-04 ENCOUNTER — Ambulatory Visit (INDEPENDENT_AMBULATORY_CARE_PROVIDER_SITE_OTHER): Payer: Medicaid Other

## 2020-04-06 ENCOUNTER — Other Ambulatory Visit: Payer: Self-pay

## 2020-04-06 ENCOUNTER — Ambulatory Visit (INDEPENDENT_AMBULATORY_CARE_PROVIDER_SITE_OTHER): Payer: Medicaid Other

## 2020-04-06 VITALS — BP 144/95 | HR 117 | Temp 98.1°F | Ht 67.5 in | Wt 182.4 lb

## 2020-04-06 DIAGNOSIS — Z3042 Encounter for surveillance of injectable contraceptive: Secondary | ICD-10-CM

## 2020-04-06 MED ORDER — MEDROXYPROGESTERONE ACETATE 150 MG/ML IM SUSY
150.0000 mg | PREFILLED_SYRINGE | Freq: Once | INTRAMUSCULAR | Status: AC
  Administered 2020-04-06: 150 mg via INTRAMUSCULAR

## 2020-06-22 ENCOUNTER — Ambulatory Visit (INDEPENDENT_AMBULATORY_CARE_PROVIDER_SITE_OTHER): Payer: Medicaid Other

## 2020-07-25 IMAGING — CR DG CHEST 2V
2 series · 2 of 2 positions shown · non-contrast
Comparison: None.

CLINICAL DATA: Chest congestion, cough

EXAM:
CHEST - 2 VIEW

[w chest pa]
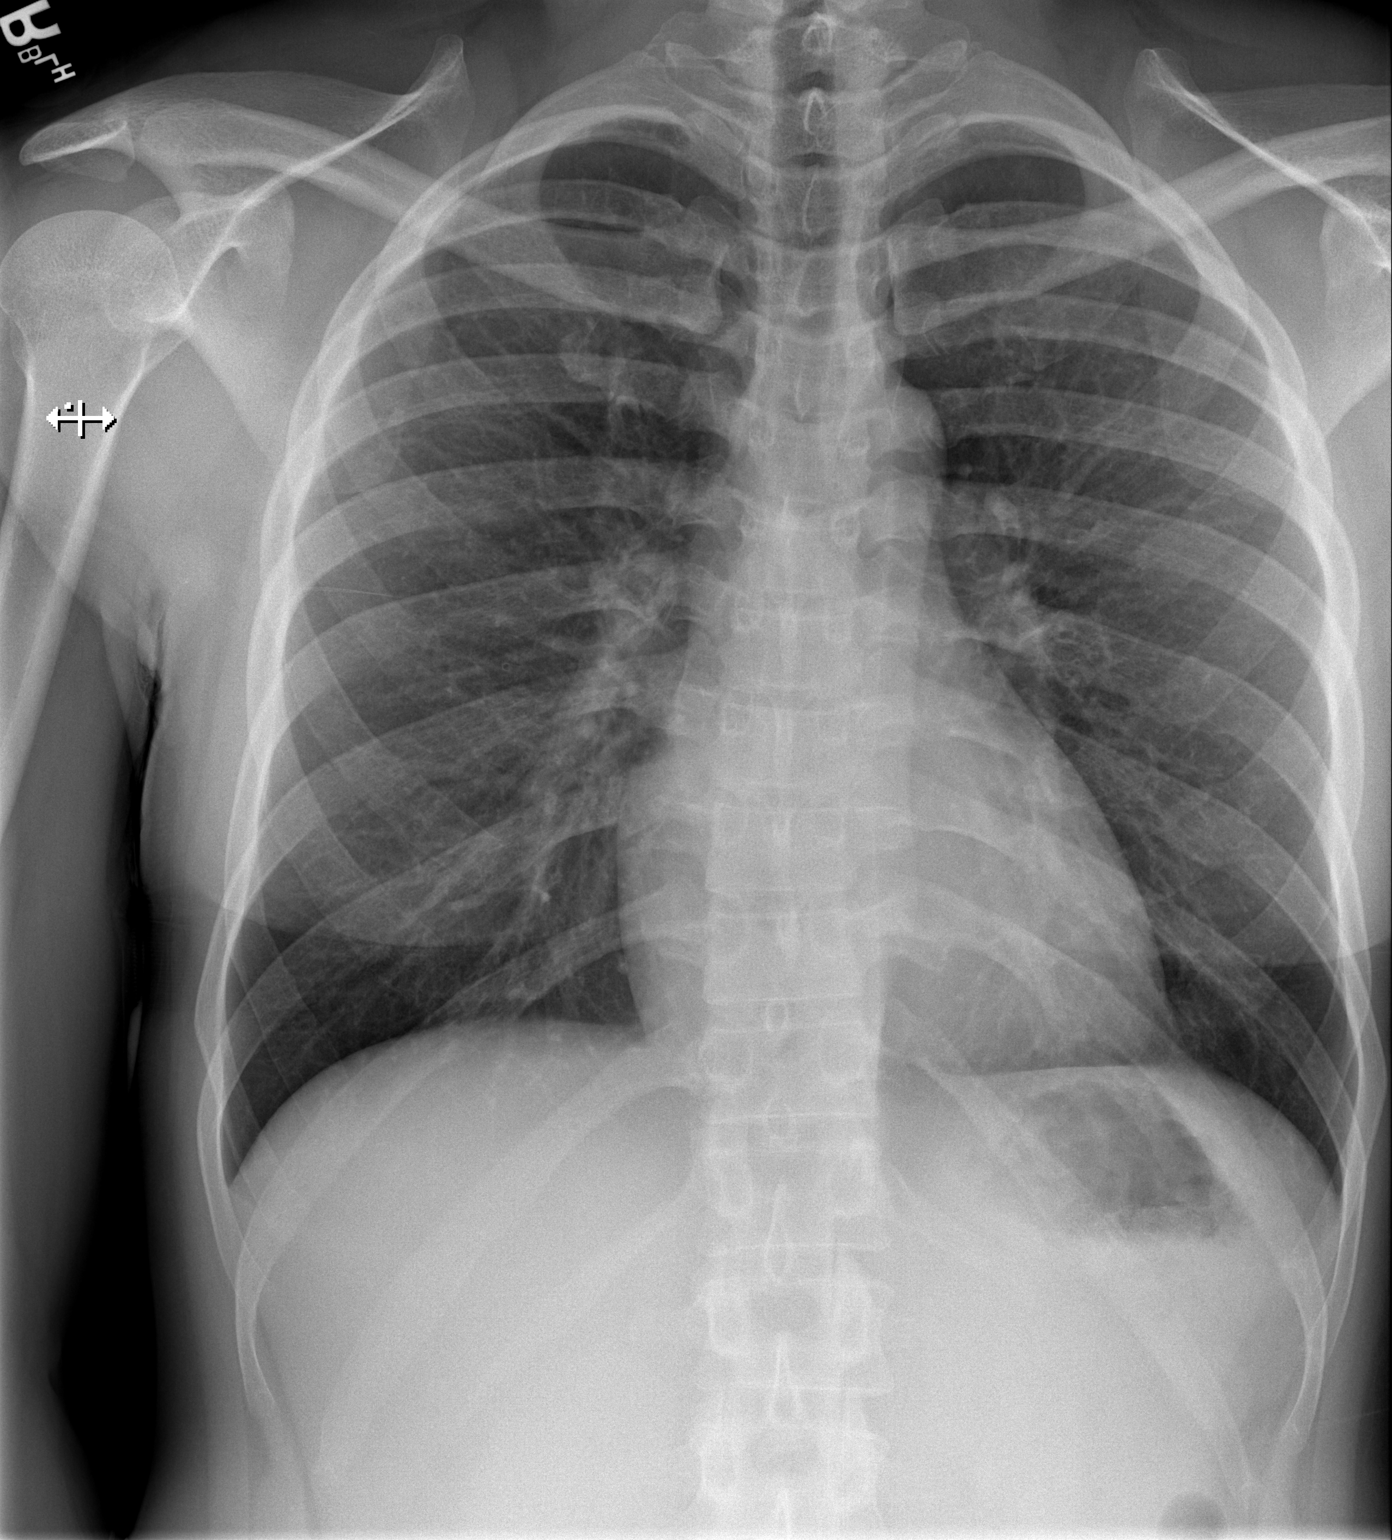

[w chest lat]
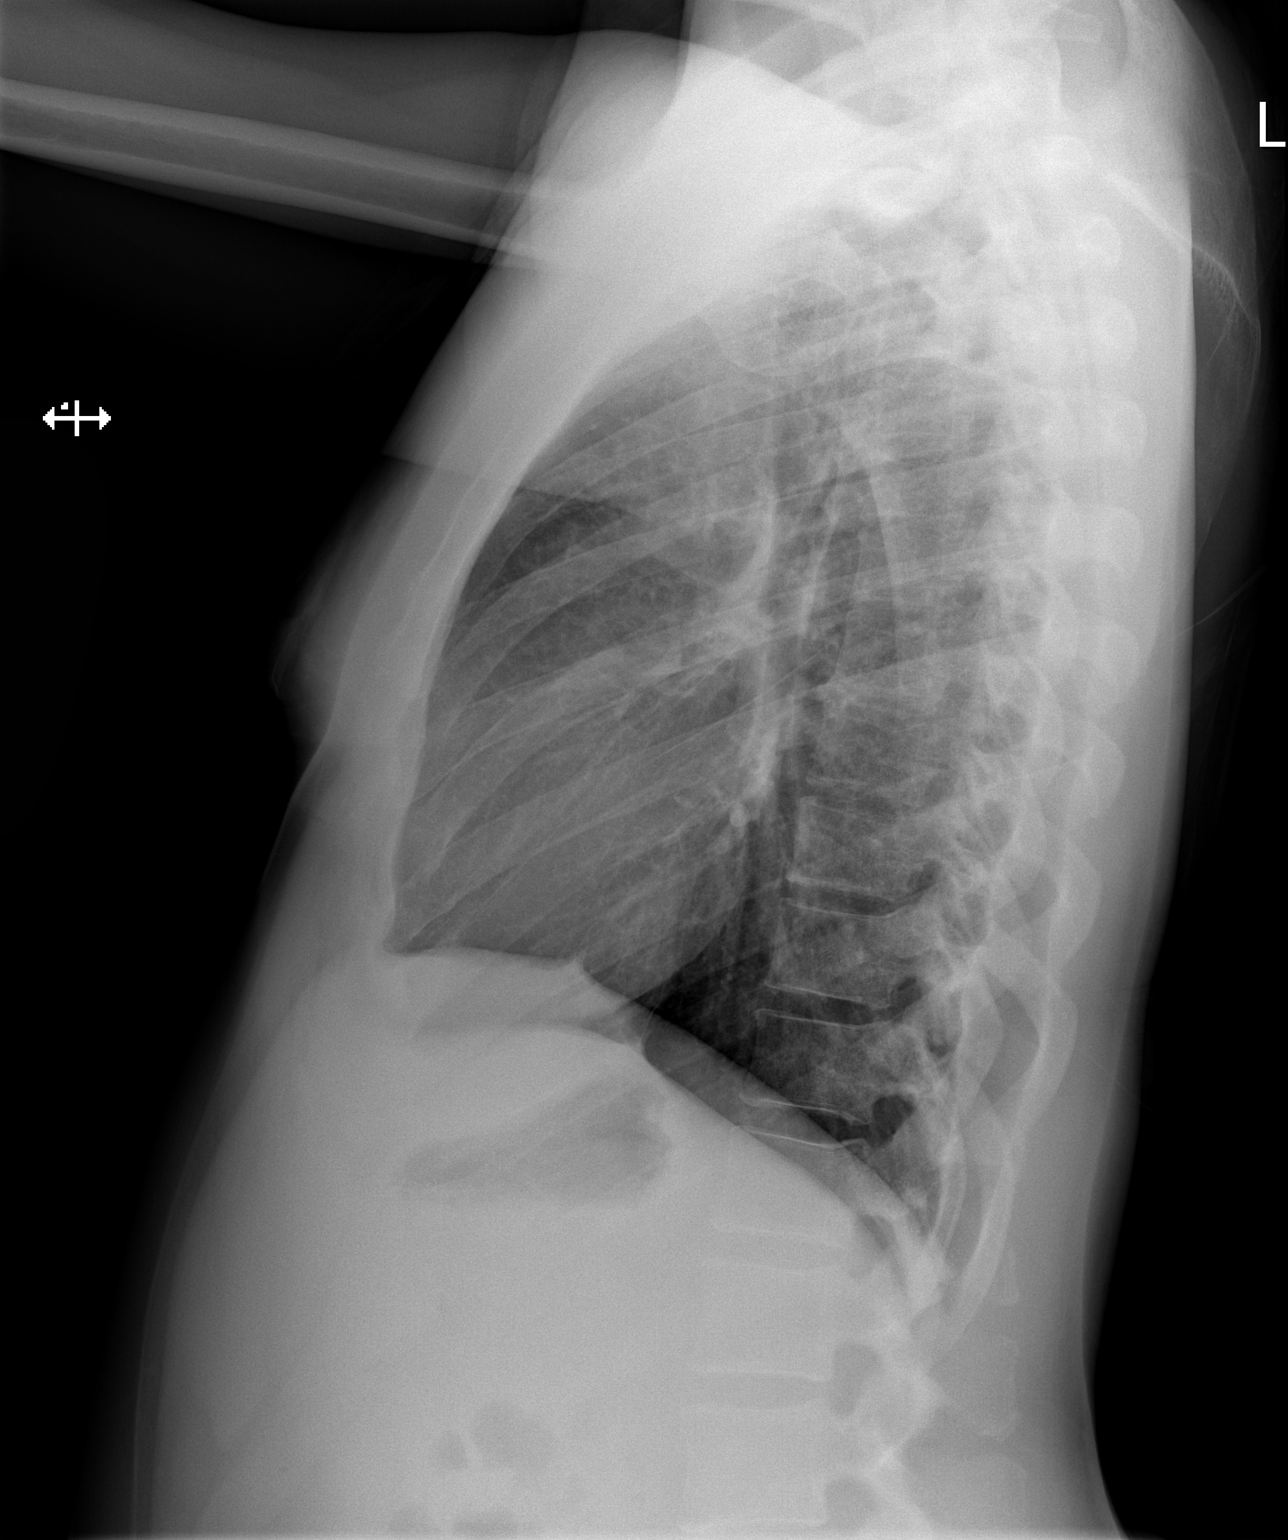

[2 of 2 positions shown; findings below may reference images not displayed]

FINDINGS: Lungs are clear.  No pleural effusion or pneumothorax.

The heart is normal in size.

Visualized osseous structures are within normal limits.
IMPRESSION: Normal chest radiographs.

## 2020-07-26 ENCOUNTER — Ambulatory Visit (INDEPENDENT_AMBULATORY_CARE_PROVIDER_SITE_OTHER): Payer: Medicaid Other

## 2020-07-26 ENCOUNTER — Other Ambulatory Visit: Payer: Self-pay

## 2020-07-27 ENCOUNTER — Ambulatory Visit (INDEPENDENT_AMBULATORY_CARE_PROVIDER_SITE_OTHER): Payer: Medicaid Other | Admitting: Primary Care

## 2020-08-03 DIAGNOSIS — Z5181 Encounter for therapeutic drug level monitoring: Secondary | ICD-10-CM | POA: Diagnosis not present

## 2020-08-17 DIAGNOSIS — Z5181 Encounter for therapeutic drug level monitoring: Secondary | ICD-10-CM | POA: Diagnosis not present

## 2020-10-05 DIAGNOSIS — Z5181 Encounter for therapeutic drug level monitoring: Secondary | ICD-10-CM | POA: Diagnosis not present

## 2020-12-25 DIAGNOSIS — B349 Viral infection, unspecified: Secondary | ICD-10-CM | POA: Diagnosis not present

## 2020-12-25 DIAGNOSIS — R197 Diarrhea, unspecified: Secondary | ICD-10-CM | POA: Diagnosis not present

## 2020-12-25 DIAGNOSIS — R112 Nausea with vomiting, unspecified: Secondary | ICD-10-CM | POA: Diagnosis not present

## 2021-01-02 DIAGNOSIS — Z5181 Encounter for therapeutic drug level monitoring: Secondary | ICD-10-CM | POA: Diagnosis not present

## 2021-01-10 ENCOUNTER — Ambulatory Visit (INDEPENDENT_AMBULATORY_CARE_PROVIDER_SITE_OTHER): Payer: Medicaid Other | Admitting: Primary Care

## 2021-01-10 ENCOUNTER — Other Ambulatory Visit (HOSPITAL_COMMUNITY)
Admission: RE | Admit: 2021-01-10 | Discharge: 2021-01-10 | Disposition: A | Discharge status: Home / Self Care | Payer: Medicaid Other | Source: Ambulatory Visit | Attending: Primary Care | Admitting: Primary Care

## 2021-01-10 ENCOUNTER — Other Ambulatory Visit: Payer: Self-pay

## 2021-01-10 ENCOUNTER — Encounter (INDEPENDENT_AMBULATORY_CARE_PROVIDER_SITE_OTHER): Payer: Self-pay | Admitting: Primary Care

## 2021-01-10 VITALS — BP 112/76 | HR 83 | Temp 97.5°F | Ht 67.5 in | Wt 176.4 lb

## 2021-01-10 DIAGNOSIS — N898 Other specified noninflammatory disorders of vagina: Secondary | ICD-10-CM

## 2021-01-10 DIAGNOSIS — Z30013 Encounter for initial prescription of injectable contraceptive: Secondary | ICD-10-CM

## 2021-01-10 DIAGNOSIS — Z3202 Encounter for pregnancy test, result negative: Secondary | ICD-10-CM | POA: Diagnosis not present

## 2021-01-10 DIAGNOSIS — Z0001 Encounter for general adult medical examination with abnormal findings: Secondary | ICD-10-CM

## 2021-01-10 DIAGNOSIS — Z0000 Encounter for general adult medical examination without abnormal findings: Secondary | ICD-10-CM

## 2021-01-10 LAB — POCT URINE PREGNANCY: Preg Test, Ur: NEGATIVE

## 2021-01-10 MED ORDER — MEDROXYPROGESTERONE ACETATE 150 MG/ML IM SUSY
150.0000 mg | PREFILLED_SYRINGE | Freq: Once | INTRAMUSCULAR | Status: AC
  Administered 2021-01-10: 150 mg via INTRAMUSCULAR

## 2021-01-10 NOTE — Progress Notes (Signed)
Next depo due Dec 14- 28

## 2021-01-10 NOTE — Patient Instructions (Signed)

## 2021-01-10 NOTE — Progress Notes (Signed)
Andrea Wolfe is a 40 y.o. female presents to office today for annual physical exam examination.    Concerns today include: 1. Vaginal ordor   Occupation: Doctor, hospital , Marital status: S, Substance use: yes Diet: healthy , Exercise: yes  Last eye exam: none Last dental exam: none  Last mammogram: ordered  Last pap smear: 06/17/2018 Refills needed today: No Immunizations needed: Flu Vaccine: no  Tdap Vaccine: yes  - every 19yrs - (<3 lifetime doses or unknown): all wounds -- look up need for Tetanus IG - (>=3 lifetime doses): clean/minor wound if >93yrs from previous; all other wounds if >31yrs from previous   Social History   Socioeconomic History   Marital status: Single    Spouse name: Not on file   Number of children: Not on file   Years of education: Not on file   Highest education level: Not on file  Occupational History   Not on file  Tobacco Use   Smoking status: Some Days    Packs/day: 0.50    Types: Cigarettes   Smokeless tobacco: Never  Substance and Sexual Activity   Alcohol use: Yes   Drug use: Yes    Comment: last smoked 07/11/14   Sexual activity: Yes  Other Topics Concern   Not on file  Social History Narrative   Not on file   Social Determinants of Health   Financial Resource Strain: Not on file  Food Insecurity: Not on file  Transportation Needs: Not on file  Physical Activity: Not on file  Stress: Not on file  Social Connections: Not on file  Intimate Partner Violence: Not on file   Past Surgical History:  Procedure Laterality Date   FOOT SURGERY     INDUCED ABORTION     Family History  Problem Relation Age of Onset   Hypertension Mother    Diabetes Mother    No current outpatient medications on file.  No Known Allergies   ROS: Review of Systems Pertinent items noted in HPI and remainder of comprehensive ROS otherwise negative.    Physical exam BP 112/76 (BP Location: Right Arm, Patient Position: Sitting, Cuff Size:  Normal)   Pulse 83   Temp (!) 97.5 F (36.4 C) (Temporal)   Ht 5' 7.5" (1.715 m)   Wt 176 lb 6.4 oz (80 kg)   LMP 12/16/2020 (Approximate)   SpO2 100%   BMI 27.22 kg/m  General appearance: alert, cooperative, and appears stated age Head: Normocephalic, without obvious abnormality, atraumatic Eyes: conjunctivae/corneas clear. PERRL, EOM's intact. Fundi benign. Ears: normal TM's and external ear canals both ears Nose: Nares normal. Septum midline. Mucosa normal. No drainage or sinus tenderness. Neck: no adenopathy, no carotid bruit, no JVD, supple, symmetrical, trachea midline, and thyroid not enlarged, symmetric, no tenderness/mass/nodules Back: symmetric, no curvature. ROM normal. No CVA tenderness. Lungs: clear to auscultation bilaterally Heart: regular rate and rhythm, S1, S2 normal, no murmur, click, rub or gallop Abdomen: soft, non-tender; bowel sounds normal; no masses,  no organomegaly Pelvic: vagina normal without discharge Extremities: extremities normal, atraumatic, no cyanosis or edema    Assessment/ Plan: Andrea Wolfe here for annual physical exam.   Andrea Wolfe was seen today for annual exam.  Diagnoses and all orders for this visit:  Vaginal odor -     Cervicovaginal ancillary only  Vaginal discharge -     Cervicovaginal ancillary only  Encounter for initial prescription of injectable contraceptive -     POCT urine pregnancy -  medroxyPROGESTERone Acetate SUSY 150 mg  Annual physical exam    Counseled on healthy lifestyle choices, including diet (rich in fruits, vegetables and lean meats and low in salt and simple carbohydrates) and exercise (at least 30 minutes of moderate physical activity daily).  Patient to follow up in 1 year for annual exam or sooner if needed.  The above assessment and management plan was discussed with the patient. The patient verbalized understanding of and has agreed to the management plan. Patient is aware to call the clinic if  symptoms persist or worsen. Patient is aware when to return to the clinic for a follow-up visit. Patient educated on when it is appropriate to go to the emergency department.   Juluis Mire NP-C [address] [place] [phone]

## 2021-01-10 NOTE — Progress Notes (Signed)
mep

## 2021-01-11 LAB — CERVICOVAGINAL ANCILLARY ONLY
Bacterial Vaginitis (gardnerella): POSITIVE — AB
Candida Glabrata: NEGATIVE
Candida Vaginitis: NEGATIVE
Chlamydia: NEGATIVE
Comment: NEGATIVE
Comment: NEGATIVE
Comment: NEGATIVE
Comment: NEGATIVE
Comment: NEGATIVE
Comment: NORMAL
Neisseria Gonorrhea: NEGATIVE
Trichomonas: NEGATIVE

## 2021-01-12 ENCOUNTER — Telehealth (INDEPENDENT_AMBULATORY_CARE_PROVIDER_SITE_OTHER): Payer: Self-pay

## 2021-01-12 ENCOUNTER — Other Ambulatory Visit (INDEPENDENT_AMBULATORY_CARE_PROVIDER_SITE_OTHER): Payer: Self-pay | Admitting: Primary Care

## 2021-01-12 DIAGNOSIS — N760 Acute vaginitis: Secondary | ICD-10-CM

## 2021-01-12 DIAGNOSIS — B9689 Other specified bacterial agents as the cause of diseases classified elsewhere: Secondary | ICD-10-CM

## 2021-01-12 MED ORDER — METRONIDAZOLE 500 MG PO TABS: 500.0000 mg | ORAL_TABLET | Freq: Two times a day (BID) | ORAL | 0 refills | Status: DC

## 2021-01-12 NOTE — Telephone Encounter (Signed)
Patient returned call and was made aware of results and medication being sent. Nat Christen, CMA

## 2021-01-12 NOTE — Telephone Encounter (Signed)
-----   Message from Kerin Perna, NP sent at 01/12/2021  9:12 AM EDT ----- You have bacterial vaginosis. A prescription for metronidazole. You take this medication twice a day for 7 days. Be sure that you do not drink alcohol when you take this medication because the combination can give you severe nausea and vomiting.  It can sometimes give people a metallic taste in their mouth while they are taking it. When you take antibiotics, it can wipe out your gut flora.  This can cause problems like diarrhea.

## 2021-01-24 DIAGNOSIS — R55 Syncope and collapse: Secondary | ICD-10-CM | POA: Diagnosis not present

## 2021-02-21 ENCOUNTER — Other Ambulatory Visit: Payer: Self-pay

## 2021-02-21 ENCOUNTER — Ambulatory Visit (INDEPENDENT_AMBULATORY_CARE_PROVIDER_SITE_OTHER): Payer: Medicaid Other | Admitting: Primary Care

## 2021-02-21 DIAGNOSIS — F3289 Other specified depressive episodes: Secondary | ICD-10-CM

## 2021-05-16 ENCOUNTER — Ambulatory Visit (INDEPENDENT_AMBULATORY_CARE_PROVIDER_SITE_OTHER): Payer: Medicaid Other | Admitting: Primary Care

## 2021-05-24 ENCOUNTER — Ambulatory Visit (INDEPENDENT_AMBULATORY_CARE_PROVIDER_SITE_OTHER): Payer: Medicaid Other | Admitting: Primary Care

## 2021-11-06 ENCOUNTER — Encounter (INDEPENDENT_AMBULATORY_CARE_PROVIDER_SITE_OTHER): Payer: Medicaid Other | Admitting: Primary Care

## 2021-11-13 ENCOUNTER — Encounter (INDEPENDENT_AMBULATORY_CARE_PROVIDER_SITE_OTHER): Payer: Medicaid Other | Admitting: Primary Care

## 2022-03-18 ENCOUNTER — Other Ambulatory Visit (HOSPITAL_COMMUNITY)
Admission: RE | Admit: 2022-03-18 | Discharge: 2022-03-18 | Disposition: A | Discharge status: Home / Self Care | Payer: Medicaid Other | Source: Ambulatory Visit | Attending: Primary Care | Admitting: Primary Care

## 2022-03-18 ENCOUNTER — Ambulatory Visit (INDEPENDENT_AMBULATORY_CARE_PROVIDER_SITE_OTHER): Payer: Medicaid Other | Admitting: Primary Care

## 2022-03-18 ENCOUNTER — Encounter (INDEPENDENT_AMBULATORY_CARE_PROVIDER_SITE_OTHER): Payer: Self-pay | Admitting: Primary Care

## 2022-03-18 ENCOUNTER — Other Ambulatory Visit (HOSPITAL_COMMUNITY): Admission: RE | Admit: 2022-03-18 | Payer: Medicaid Other | Source: Ambulatory Visit

## 2022-03-18 VITALS — BP 124/77 | HR 75 | Resp 16 | Ht 67.0 in | Wt 170.0 lb

## 2022-03-18 DIAGNOSIS — Z30013 Encounter for initial prescription of injectable contraceptive: Secondary | ICD-10-CM

## 2022-03-18 DIAGNOSIS — F3289 Other specified depressive episodes: Secondary | ICD-10-CM

## 2022-03-18 DIAGNOSIS — Z1231 Encounter for screening mammogram for malignant neoplasm of breast: Secondary | ICD-10-CM

## 2022-03-18 DIAGNOSIS — Z3202 Encounter for pregnancy test, result negative: Secondary | ICD-10-CM | POA: Diagnosis not present

## 2022-03-18 DIAGNOSIS — N898 Other specified noninflammatory disorders of vagina: Secondary | ICD-10-CM | POA: Diagnosis present

## 2022-03-18 LAB — POCT URINE PREGNANCY: Preg Test, Ur: NEGATIVE

## 2022-03-18 MED ORDER — MEDROXYPROGESTERONE ACETATE 150 MG/ML IM SUSP
150.0000 mg | Freq: Once | INTRAMUSCULAR | Status: AC
  Administered 2022-03-18: 150 mg via INTRAMUSCULAR

## 2022-03-18 NOTE — Progress Notes (Signed)
[facility] Patient name: Andrea Wolfe MRN [id number]  Date of birth: 1980/05/27 Chief Complaint:   Gynecologic Exam  History of Present Illness:   Andrea Wolfe is a 41 y.o. [phone] female being seen today for a routine well-woman exam.   CC:gyn   The current method of family planning is Depo-Provera injections.  Patient's last menstrual period was 02/23/2022 (approximate). Last mammogram: Greater than 2 years. Results were: normal. Family h/o breast cancer: No   Review of Systems:    Denies any headaches, blurred vision, fatigue, shortness of breath, chest pain, abdominal pain, abnormal vaginal discharge/itching/odor/irritation, problems with periods, bowel movements, urination, or intercourse unless otherwise stated above.  Pertinent History Reviewed:   Reviewed past medical,surgical, social and family history.  Reviewed problem list, medications and allergies.  Physical Assessment:   Vitals:   03/18/22 1036  BP: 124/77  Pulse: 75  Resp: 16  SpO2: 99%  Weight: 170 lb (77.1 kg)  Height: '5\' 7"'$  (1.702 m)  Body mass index is 26.63 kg/m.        Physical Examination:  General appearance - well appearing, and in no distress Mental status - alert, oriented to person, place, and time Psych:  She has a normal mood and affect Skin - warm and dry, normal color, no suspicious lesions noted Chest - effort normal, all lung fields clear to auscultation bilaterally Heart - normal rate and regular rhythm Neck:  midline trachea, no thyromegaly or nodules Breasts - breasts appear normal, no suspicious masses, no skin or nipple changes or axillary nodes Educated patient on proper self breast examination and had patient to demonstrate SBE. Abdomen - soft, nontender, nondistended, no masses or organomegaly Pelvic-VULVA: normal appearing vulva with no masses, tenderness or lesions   VAGINA: normal appearing vagina with normal color and  discharge, no lesions   CERVIX: normal appearing cervix without discharge or lesions, no CMT UTERUS: uterus is felt to be normal size, shape, consistency and nontender  ADNEXA: No adnexal masses or tenderness noted. Extremities:  No swelling or varicosities noted  Results for orders placed or performed in visit on 03/18/22 (from the past 24 hour(s))  POCT urine pregnancy   Collection Time: 03/18/22 10:57 AM  Result Value Ref Range   Preg Test, Ur Negative Negative     Assessment & Plan:  Andrea Wolfe was seen today for gynecologic exam.  Diagnoses and all orders for this visit:  Encounter for screening mammogram for malignant neoplasm of breast -     MM 3D SCREEN BREAST BILATERAL; Future  Vaginal odor -     Cervicovaginal ancillary only -     Cytology - PAP([facility])  Vaginal discharge -     Cervicovaginal ancillary only -     Cytology - PAP([facility])  Encounter for initial prescription of injectable contraceptive -     POCT urine pregnancy -     medroxyPROGESTERone (DEPO-PROVERA) injection 150 mg  Other depression   Other depression Flowsheet Row Office Visit from 03/18/2022 in [place]  PHQ-2 Total Score 1       Mammogram order  or sooner if problems   Orders Placed This Encounter  Procedures   MM 3D SCREEN BREAST BILATERAL   POCT urine pregnancy    Meds:  Meds ordered this encounter  Medications   medroxyPROGESTERone (DEPO-PROVERA) injection 150 mg    Follow-up: 3-6 months This note has been created with Dragon speech recognition software and  smart Company secretary. Any transcriptional errors are unintentional.   Kerin Perna, NP 03/18/2022, 2:08 PM

## 2022-03-18 NOTE — Progress Notes (Signed)
Restart depo provera Referral to podiatry STD screening- vaginal discharge and odor Muscular and joint aches

## 2022-03-20 LAB — CERVICOVAGINAL ANCILLARY ONLY
Bacterial Vaginitis (gardnerella): NEGATIVE
Candida Glabrata: NEGATIVE
Candida Vaginitis: NEGATIVE
Chlamydia: NEGATIVE
Comment: NEGATIVE
Comment: NEGATIVE
Comment: NEGATIVE
Comment: NEGATIVE
Comment: NEGATIVE
Comment: NORMAL
Neisseria Gonorrhea: NEGATIVE
Trichomonas: NEGATIVE

## 2022-03-25 ENCOUNTER — Encounter (INDEPENDENT_AMBULATORY_CARE_PROVIDER_SITE_OTHER): Payer: Self-pay

## 2022-04-03 LAB — CYTOLOGY - PAP
Diagnosis: NEGATIVE
Diagnosis: REACTIVE

## 2022-04-16 ENCOUNTER — Encounter (INDEPENDENT_AMBULATORY_CARE_PROVIDER_SITE_OTHER): Payer: Self-pay | Admitting: *Deleted

## 2022-06-03 ENCOUNTER — Ambulatory Visit (INDEPENDENT_AMBULATORY_CARE_PROVIDER_SITE_OTHER): Payer: Medicaid Other

## 2022-06-03 ENCOUNTER — Telehealth: Payer: Self-pay | Admitting: Emergency Medicine

## 2022-06-03 NOTE — Telephone Encounter (Signed)
Please contact pt and reschedule. Pt can reschedule depo in between Feb 20-march 5

## 2022-06-03 NOTE — Telephone Encounter (Signed)
Copied from [place] [id number]. Topic: General - Other >> Jun 03, 2022 10:19 AM Oley Balm A wrote: Reason for CRM: Pt missed her appt today for her Depo shot and is calling to see when she can be rescheduled.  Please advise.

## 2023-08-28 ENCOUNTER — Emergency Department (HOSPITAL_COMMUNITY)
Admission: EM | Admit: 2023-08-28 | Discharge: 2023-08-28 | Disposition: A | Discharge status: Home / Self Care | Attending: Emergency Medicine | Admitting: Emergency Medicine

## 2023-08-28 ENCOUNTER — Other Ambulatory Visit: Payer: Self-pay

## 2023-08-28 ENCOUNTER — Emergency Department (HOSPITAL_COMMUNITY)

## 2023-08-28 DIAGNOSIS — M62830 Muscle spasm of back: Secondary | ICD-10-CM | POA: Insufficient documentation

## 2023-08-28 DIAGNOSIS — F17210 Nicotine dependence, cigarettes, uncomplicated: Secondary | ICD-10-CM | POA: Insufficient documentation

## 2023-08-28 DIAGNOSIS — M546 Pain in thoracic spine: Secondary | ICD-10-CM | POA: Diagnosis present

## 2023-08-28 MED ORDER — DIAZEPAM 5 MG/ML IJ SOLN
5.0000 mg | Freq: Once | INTRAMUSCULAR | Status: AC
  Administered 2023-08-28: 5 mg via INTRAMUSCULAR
  Filled 2023-08-28: qty 2

## 2023-08-28 MED ORDER — CYCLOBENZAPRINE HCL 10 MG PO TABS: 10.0000 mg | ORAL_TABLET | Freq: Two times a day (BID) | ORAL | 0 refills | Status: AC | PRN

## 2023-08-28 MED ORDER — KETOROLAC TROMETHAMINE 15 MG/ML IJ SOLN
15.0000 mg | Freq: Once | INTRAMUSCULAR | Status: AC
  Administered 2023-08-28: 15 mg via INTRAMUSCULAR
  Filled 2023-08-28: qty 1

## 2023-08-28 MED ORDER — HYDROCODONE-ACETAMINOPHEN 5-325 MG PO TABS
1.0000 | ORAL_TABLET | Freq: Once | ORAL | Status: AC
  Administered 2023-08-28: 1 via ORAL
  Filled 2023-08-28: qty 1

## 2023-08-28 NOTE — Discharge Instructions (Addendum)
 We evaluated you for your back pain.  Your x-ray was negative.  Your symptoms are most likely due to a muscle spasm. Please take Tylenol  (acetaminophen ) and Motrin  (ibuprofen ) for your symptoms at home.  You can take 1000 mg of Tylenol  every 6 hours and 600 mg of Motrin  every 6 hours as needed for your symptoms.  You can take these medicines together as needed, either at the same time, or alternating every 3 hours.  Please return if you develop any new or worsening symptoms such as shortness of breath, severe uncontrolled pain, worsening pain, lightheadedness or dizziness, fainting, or any other new symptoms.
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
## 2023-08-29 NOTE — ED Provider Notes (Signed)
 [place] EMERGENCY DEPARTMENT AT [facility] Provider Note  CSN: [id number] Arrival date & time: 08/28/23 1909  Chief Complaint(s) Back Pain and Shoulder Pain  HPI Andrea Wolfe is a 43 y.o. female without significant PMH p/w thoracic R sided back pain. Reports pain is worse with movement. No trauma. Not pleuritic. No shob. No cough. No fevers or chills. Improves some with tylenol /motrin  but persistent. Present for few days. No recent travel or surgeries. No history dvt/PE. Not on OCP   Past Medical History Past Medical History:  Diagnosis Date   Medical history non-contributory    There are no active problems to display for this patient.  Home Medication(s) Prior to Admission medications   Medication Sig Start Date End Date Taking? Authorizing Provider  cyclobenzaprine (FLEXERIL) 10 MG tablet Take 1 tablet (10 mg total) by mouth 2 (two) times daily as needed for muscle spasms. 08/28/23  Yes Mordecai Applebaum, MD                                                                                                                                    Past Surgical History Past Surgical History:  Procedure Laterality Date   FOOT SURGERY     INDUCED ABORTION     Family History Family History  Problem Relation Age of Onset   Hypertension Mother    Diabetes Mother     Social History Social History   Tobacco Use   Smoking status: Some Days    Current packs/day: 0.50    Types: Cigarettes   Smokeless tobacco: Never  Substance Use Topics   Alcohol use: Not Currently    Comment: 2 16 oz cans of beer/week, 1 pint of liquour   Drug use: Yes    Types: Marijuana    Comment: last smoked 07/11/14 smokes marijuana daily   Allergies Patient has no known allergies.  Review of Systems Review of Systems  All other systems reviewed and are negative.   Physical Exam Vital Signs  I have reviewed the triage vital signs BP 138/88 (BP Location: Left Arm)   Pulse 80   Temp  97.9 F (36.6 C) (Oral)   Resp 18   SpO2 99%  Physical Exam Vitals and nursing note reviewed.  Constitutional:      General: She is not in acute distress.    Appearance: She is well-developed.  HENT:     Head: Normocephalic and atraumatic.     Mouth/Throat:     Mouth: Mucous membranes are moist.  Eyes:     Pupils: Pupils are equal, round, and reactive to light.  Cardiovascular:     Rate and Rhythm: Normal rate and regular rhythm.     Heart sounds: No murmur heard. Pulmonary:     Effort: Pulmonary effort is normal. No respiratory distress.     Breath sounds: Normal breath sounds.  Abdominal:     General: Abdomen is  flat.     Palpations: Abdomen is soft.     Tenderness: There is no abdominal tenderness.  Musculoskeletal:        General: No tenderness.     Right lower leg: No edema.     Left lower leg: No edema.     Comments: Mild R paraspinal thoracic ttp w/o midline ttp  Skin:    General: Skin is warm and dry.  Neurological:     General: No focal deficit present.     Mental Status: She is alert. Mental status is at baseline.  Psychiatric:        Mood and Affect: Mood normal.        Behavior: Behavior normal.     ED Results and Treatments Labs (all labs ordered are listed, but only abnormal results are displayed) Labs Reviewed - No data to display                                                                                                                        Radiology DG Chest 2 View Result Date: 08/28/2023 CLINICAL DATA:  Right back pain, shoulder pain EXAM: CHEST - 2 VIEW COMPARISON:  03/20/2019 FINDINGS: The heart size and mediastinal contours are within normal limits. Both lungs are clear. The visualized skeletal structures are unremarkable. IMPRESSION: No active cardiopulmonary disease. Electronically Signed   By: Janeece Mechanic M.D.   On: 08/28/2023 21:26    Pertinent labs & imaging results that were available during my care of the patient were reviewed by  me and considered in my medical decision making (see MDM for details).  Medications Ordered in ED Medications  ketorolac  (TORADOL ) 15 MG/ML injection 15 mg (15 mg Intramuscular Given 08/28/23 2056)  diazepam (VALIUM) injection 5 mg (5 mg Intramuscular Given 08/28/23 2056)  HYDROcodone -acetaminophen  (NORCO/VICODIN) 5-325 MG per tablet 1 tablet (1 tablet Oral Given 08/28/23 2158)                                                                                                                                     Procedures Procedures  (including critical care time)  Medical Decision Making / ED Course   MDM:  43 y/o with back pain  Suspect MSK cause e.g. spasm. Vitals reassuring. CXR clear. No PTX or PNA. No midline ttp to suspect fx and no trauma. Considered PE but pt is PERC negative. Extremely low concern  for other dangerous cause of back pain such as dissection. Improved with treatment in ER. Will give muscle relaxer. Advised sedating nature of medication and avoid etoh/driving. Will discharge patient to home. All questions answered. Patient comfortable with plan of discharge. Return precautions discussed with patient and specified on the after visit summary.       Imaging Studies ordered: I ordered imaging studies including CXR On my interpretation imaging demonstrates no acute process I independently visualized and interpreted imaging. I agree with the radiologist interpretation   Medicines ordered and prescription drug management: Meds ordered this encounter  Medications   ketorolac  (TORADOL ) 15 MG/ML injection 15 mg   diazepam (VALIUM) injection 5 mg   HYDROcodone -acetaminophen  (NORCO/VICODIN) 5-325 MG per tablet 1 tablet    Refill:  0   cyclobenzaprine (FLEXERIL) 10 MG tablet    Sig: Take 1 tablet (10 mg total) by mouth 2 (two) times daily as needed for muscle spasms.    Dispense:  20 tablet    Refill:  0    -I have reviewed the patients home medicines and have made  adjustments as needed  Reevaluation: After the interventions noted above, I reevaluated the patient and found that their symptoms have improved  Co morbidities that complicate the patient evaluation  Past Medical History:  Diagnosis Date   Medical history non-contributory       Dispostion: Disposition decision including need for hospitalization was considered, and patient discharged from emergency department.    Final Clinical Impression(s) / ED Diagnoses Final diagnoses:  Spasm of thoracic back muscle     This chart was dictated using voice recognition software.  Despite best efforts to proofread,  errors can occur which can change the documentation meaning.    Mordecai Applebaum, MD 08/29/23 [phone]

## 2023-09-01 ENCOUNTER — Ambulatory Visit: Payer: Self-pay | Admitting: *Deleted
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
## 2023-09-01 NOTE — Telephone Encounter (Signed)
 Second attempt made - phone rang and received message "call cannot be completed at this time"

## 2023-09-01 NOTE — Telephone Encounter (Signed)
  Call not connected when agent transferred- attempted to call patient back - x2 got message"Call can not be completed at this time message"  Copied from CRM [id number]. Topic: Clinical - Red Word Triage >> Sep 01, 2023  9:12 AM [name] wrote: Red Word that prompted transfer to Nurse Triage: The patient called in stating she has been having severe back pain lately. She was seen at [facility] ON 5/15 and received 2 shots and muscle relaxer's which did not help at all. She also says she is not sleeping at night. I will transfer her to [facility]

## 2023-09-02 ENCOUNTER — Ambulatory Visit (INDEPENDENT_AMBULATORY_CARE_PROVIDER_SITE_OTHER): Payer: Self-pay | Admitting: Primary Care

## 2023-09-02 ENCOUNTER — Telehealth (INDEPENDENT_AMBULATORY_CARE_PROVIDER_SITE_OTHER): Payer: Self-pay | Admitting: Primary Care

## 2023-09-02 NOTE — Telephone Encounter (Signed)
 Called pt to reschedule missed appt. Pt did not answer and could not LVM.

## 2023-09-08 ENCOUNTER — Emergency Department (HOSPITAL_COMMUNITY)

## 2023-09-08 ENCOUNTER — Emergency Department (HOSPITAL_COMMUNITY)
Admission: EM | Admit: 2023-09-08 | Discharge: 2023-09-08 | Disposition: A | Discharge status: Home / Self Care | Attending: Emergency Medicine | Admitting: Emergency Medicine

## 2023-09-08 DIAGNOSIS — R079 Chest pain, unspecified: Secondary | ICD-10-CM | POA: Diagnosis not present

## 2023-09-08 DIAGNOSIS — E876 Hypokalemia: Secondary | ICD-10-CM | POA: Insufficient documentation

## 2023-09-08 DIAGNOSIS — F17210 Nicotine dependence, cigarettes, uncomplicated: Secondary | ICD-10-CM | POA: Diagnosis not present

## 2023-09-08 DIAGNOSIS — M25511 Pain in right shoulder: Secondary | ICD-10-CM | POA: Insufficient documentation

## 2023-09-08 LAB — BASIC METABOLIC PANEL WITH GFR
Anion gap: 8 (ref 5–15)
BUN: 13 mg/dL (ref 6–20)
CO2: 28 mmol/L (ref 22–32)
Calcium: 8.5 mg/dL — ABNORMAL LOW (ref 8.9–10.3)
Chloride: 100 mmol/L (ref 98–111)
Creatinine, Ser: 0.94 mg/dL (ref 0.44–1.00)
GFR, Estimated: >60 mL/min (ref >60)
Glucose, Bld: 79 mg/dL (ref 70–99)
Potassium: 3.4 mmol/L — ABNORMAL LOW (ref 3.5–5.1)
Sodium: 136 mmol/L (ref 135–145)

## 2023-09-08 LAB — HCG, SERUM, QUALITATIVE: Preg, Serum: NEGATIVE

## 2023-09-08 LAB — CBC
HCT: 38 % (ref 36.0–46.0)
Hemoglobin: 12.2 g/dL (ref 12.0–15.0)
MCH: 31.2 pg (ref 26.0–34.0)
MCHC: 32.1 g/dL (ref 30.0–36.0)
MCV: 97.2 fL (ref 80.0–100.0)
Platelets: 336 10*3/uL (ref 150–400)
RBC: 3.91 MIL/uL (ref 3.87–5.11)
RDW: 12.7 % (ref 11.5–15.5)
WBC: 7.3 10*3/uL (ref 4.0–10.5)
nRBC: 0 % (ref 0.0–0.2)

## 2023-09-08 LAB — TROPONIN I (HIGH SENSITIVITY): Troponin I (High Sensitivity): <2 ng/L (ref <18)

## 2023-09-08 MED ORDER — KETOROLAC TROMETHAMINE 15 MG/ML IJ SOLN
15.0000 mg | Freq: Once | INTRAMUSCULAR | Status: AC
  Administered 2023-09-08: 15 mg via INTRAVENOUS
  Filled 2023-09-08: qty 1

## 2023-09-08 MED ORDER — MELOXICAM 7.5 MG PO TABS: 7.5000 mg | ORAL_TABLET | Freq: Every day | ORAL | 0 refills | Status: AC

## 2023-09-08 NOTE — Discharge Instructions (Signed)
 Return to the emergency department if your symptoms worsen.  Schedule appoint with your primary care provider for tomorrow.  Start meloxicam, 1 tablet daily for muscle/joint pain.  Do not use ibuprofen  while taking meloxicam.  You can continue to use Tylenol  as needed for pain.  You can apply topical Voltaren gel to the affected area for pain relief.

## 2023-09-08 NOTE — ED Triage Notes (Signed)
 Pt arriving POV for right shoulder pain that radiates to right side of her chest. Pt also reports numbness and tingling in her right hand. Pt states she also has severe pain in her mid to lower back. Has appt with primary tomorrow. Pain 9/10 at this time. No issues with ambulation, no urinary issues.

## 2023-09-08 NOTE — ED Provider Notes (Signed)
 [place] EMERGENCY DEPARTMENT AT [facility] Provider Note   CSN: [id number] Arrival date & time: 09/08/23  1314     History  Chief Complaint  Patient presents with   Back Pain    Andrea Wolfe is a 43 y.o. female.  44 year old female presenting with right shoulder/arm/anterior chest pain.  Patient states symptoms been ongoing for about 3 days and have continued to get worse, this is different from the pain she was evaluated for recently, she denies any known inciting event/injury.  She describes this as a "stabbing" pain, she was recently evaluated for back pain and was presumed to be having muscle spasms, she was discharged with Flexeril  however she has been taking this along with ibuprofen  with minimal relief.  She smokes cigarettes, 1/2 pack/day.  Denies nausea/vomiting/abdominal pain/shortness of breath.   Back Pain Denies nausea/     Home Medications Prior to Admission medications   Medication Sig Start Date End Date Taking? Authorizing Provider  cyclobenzaprine  (FLEXERIL ) 10 MG tablet Take 1 tablet (10 mg total) by mouth 2 (two) times daily as needed for muscle spasms. 08/28/23   Mordecai Applebaum, MD      Allergies    Patient has no known allergies.    Review of Systems   Review of Systems  Musculoskeletal:  Positive for back pain.    Physical Exam Updated Vital Signs BP 118/65   Pulse 96   Temp 98.6 F (37 C) (Oral)   Resp 20   SpO2 100%  Physical Exam Vitals and nursing note reviewed.  HENT:     Head: Normocephalic.  Eyes:     Extraocular Movements: Extraocular movements intact.  Cardiovascular:     Rate and Rhythm: Normal rate and regular rhythm.     Heart sounds: Normal heart sounds.  Pulmonary:     Effort: Pulmonary effort is normal.     Breath sounds: Normal breath sounds.  Musculoskeletal:     Cervical back: Normal range of motion and neck supple.     Comments: Back: No spinous process tenderness palpation  5/5 strength  against resistance of bilateral upper and lower extremities, no evidence of weakness/sensory deficit  R shoulder: Full ROM including overhead reach/abduction/adduction/internal and external rotation  Skin:    General: Skin is warm and dry.  Neurological:     Mental Status: She is alert.     ED Results / Procedures / Treatments   Labs (all labs ordered are listed, but only abnormal results are displayed) Labs Reviewed  BASIC METABOLIC PANEL WITH GFR - Abnormal; Notable for the following components:      Result Value   Potassium 3.4 (*)    Calcium 8.5 (*)    All other components within normal limits  CBC  HCG, SERUM, QUALITATIVE  TROPONIN I (HIGH SENSITIVITY)    EKG EKG Interpretation Date/Time:  Monday Sep 08 2023 14:48:12 EDT Ventricular Rate:  84 PR Interval:  157 QRS Duration:  78 QT Interval:  349 QTC Calculation: 413 R Axis:   55  Text Interpretation: Sinus rhythm Abnormal T, consider ischemia, diffuse leads more prominent since last tracing Confirmed by Trish Furl [id number]) on 09/08/2023 3:25:01 PM  Radiology DG Chest 2 View Result Date: 09/08/2023 EXAM: 2 VIEW(S) XRAY OF THE CHEST 09/08/2023 02:30:24 PM COMPARISON: 2-view chest x-ray 08/28/2023 CLINICAL HISTORY: Chest pain. Right shoulder pain that radiates to right side of her chest. Pt also reports numbness and tingling in her right hand. FINDINGS: LUNGS AND PLEURA:  No focal pulmonary opacity. No pulmonary edema. No pleural effusion. No pneumothorax. HEART AND MEDIASTINUM: No acute abnormality of the cardiac and mediastinal silhouettes. BONES AND SOFT TISSUES: No acute osseous abnormality. IMPRESSION: 1. No acute cardiopulmonary pathology. Electronically signed by: Audree Leas MD 09/08/2023 02:39 PM EDT RP Workstation: [id number]    Procedures Procedures    Medications Ordered in ED Medications  ketorolac  (TORADOL ) 15 MG/ML injection 15 mg (has no administration in time range)    ED Course/ Medical  Decision Making/ A&P                                 Medical Decision Making This patient presents to the ED for concern of R arm/shoulder/chest pain, this involves an extensive number of treatment options, and is a complaint that carries with it a high risk of complications and morbidity.  The differential diagnosis includes ACS, stable vs unstable angina, musculoskeletal pain.     Additional history obtained:  Additional history obtained from record review External records from outside source obtained and reviewed including recent emergency department discharge note   Lab Tests:  I Ordered, and personally interpreted labs.  The pertinent results include: CBC unremarkable.  BMP with mild hypokalemia at 3.4, otherwise unremarkable.  Initial troponin within normal limits.  Serum hCG negative.   Imaging Studies ordered:  I ordered imaging studies including chest x-ray I independently visualized and interpreted imaging which showed No acute cardiopulmonary pathology.  I agree with the radiologist interpretation   Cardiac Monitoring: / EKG:  The patient was maintained on a cardiac monitor.  I personally viewed and interpreted the cardiac monitored which showed an underlying rhythm of: Normal sinus rhythm   Problem List / ED Course / Critical interventions / Medication management   I ordered medication including toradol   for pain  Reevaluation of the patient after these medicines showed that the patient improved I have reviewed the patients home medicines and have made adjustments as needed   Social Determinants of Health:  Daily tobacco use   Test / Admission - Considered:  Physical exam is largely reassuring, patient demonstrates full range of motion of the right upper extremity, there was no known inciting event/injury correlating with the initiation of this pain, I have a low suspicion for acute fracture/dislocation therefore did not feel further imaging was warranted.   Because patient complained of anterior chest pain initially I did proceed with cardiac workup was reassuring as above, low suspicion for ACS.  PERC negative.  Chest x-ray results as above.  Patient's symptoms are likely musculoskeletal in nature, did note some improvement with Toradol .  Will prescribe meloxicam to be used daily for shoulder pain, advised patient to discontinue use of ibuprofen  while on this medication.  She can continue use Tylenol  as needed for pain.  She can apply Voltaren gel to the affected area for pain relief.  She does have an appointment with her primary care provider tomorrow, I advised her to keep this appointment and discuss her symptoms with her primary care provider.  She is in agreement with this plan.  She is appropriate for discharge at this time.  Return precautions discussed.    Amount and/or Complexity of Data Reviewed Labs: ordered. Radiology: ordered.  Risk Prescription drug management.           Final Clinical Impression(s) / ED Diagnoses Final diagnoses:  Right shoulder pain, unspecified chronicity  Chest pain, unspecified type  Rx / DC Orders ED Discharge Orders          Ordered    meloxicam (MOBIC) 7.5 MG tablet  Daily        09/08/23 1541              Kendrick Pax, [place] 09/08/23 1614    Trish Furl, MD 09/09/23 1105

## 2023-09-09 ENCOUNTER — Inpatient Hospital Stay (INDEPENDENT_AMBULATORY_CARE_PROVIDER_SITE_OTHER): Admitting: Primary Care

## 2023-09-10 ENCOUNTER — Telehealth (INDEPENDENT_AMBULATORY_CARE_PROVIDER_SITE_OTHER): Payer: Self-pay | Admitting: Primary Care

## 2023-09-10 NOTE — Telephone Encounter (Signed)
 Called pt to reschedule appt. Pt was not present and I asked if pt could call us  back and reschedule missed appt.

## 2023-09-17 ENCOUNTER — Encounter: Payer: Self-pay | Admitting: Primary Care

## 2023-09-18 ENCOUNTER — Other Ambulatory Visit: Payer: Self-pay

## 2023-09-18 ENCOUNTER — Encounter (HOSPITAL_COMMUNITY): Payer: Self-pay

## 2023-09-18 ENCOUNTER — Emergency Department (HOSPITAL_COMMUNITY)
Admission: EM | Admit: 2023-09-18 | Discharge: 2023-09-18 | Disposition: A | Discharge status: Home / Self Care | Payer: Self-pay | Attending: Student | Admitting: Student

## 2023-09-18 DIAGNOSIS — R509 Fever, unspecified: Secondary | ICD-10-CM | POA: Insufficient documentation

## 2023-09-18 DIAGNOSIS — J020 Streptococcal pharyngitis: Secondary | ICD-10-CM | POA: Insufficient documentation

## 2023-09-18 DIAGNOSIS — B349 Viral infection, unspecified: Secondary | ICD-10-CM | POA: Insufficient documentation

## 2023-09-18 LAB — RESP PANEL BY RT-PCR (RSV, FLU A&B, COVID)  RVPGX2
Influenza A by PCR: NEGATIVE
Influenza B by PCR: NEGATIVE
Resp Syncytial Virus by PCR: NEGATIVE
SARS Coronavirus 2 by RT PCR: NEGATIVE

## 2023-09-18 LAB — GROUP A STREP BY PCR: Group A Strep by PCR: DETECTED — AB

## 2023-09-18 MED ORDER — PREDNISONE 20 MG PO TABS
60.0000 mg | ORAL_TABLET | Freq: Once | ORAL | Status: AC
  Administered 2023-09-18: 60 mg via ORAL
  Filled 2023-09-18: qty 3

## 2023-09-18 MED ORDER — LIDOCAINE VISCOUS HCL 2 % MT SOLN
15.0000 mL | Freq: Once | OROMUCOSAL | Status: AC
  Administered 2023-09-18: 15 mL via ORAL
  Filled 2023-09-18: qty 15

## 2023-09-18 MED ORDER — PENICILLIN G BENZATHINE 1200000 UNIT/2ML IM SUSY
1.2000 10*6.[IU] | PREFILLED_SYRINGE | Freq: Once | INTRAMUSCULAR | Status: AC
  Administered 2023-09-18: 1.2 10*6.[IU] via INTRAMUSCULAR
  Filled 2023-09-18: qty 2

## 2023-09-18 MED ORDER — ACETAMINOPHEN 500 MG PO TABS
1000.0000 mg | ORAL_TABLET | Freq: Once | ORAL | Status: AC
  Administered 2023-09-18: 1000 mg via ORAL
  Filled 2023-09-18: qty 2

## 2023-09-18 MED ORDER — ALUM & MAG HYDROXIDE-SIMETH 200-200-20 MG/5ML PO SUSP
30.0000 mL | Freq: Once | ORAL | Status: AC
  Administered 2023-09-18: 30 mL via ORAL
  Filled 2023-09-18: qty 30

## 2023-09-18 NOTE — ED Provider Notes (Signed)
 [place] EMERGENCY DEPARTMENT AT [facility] Provider Note   CSN: [id number] Arrival date & time: 09/18/23  1626     History HTN, DM Chief Complaint  Patient presents with   Sore Throat   Nausea    Andrea Wolfe is a 43 y.o. female.  43 year old female with no past medical history presents to the ED with a chief complaint of sore throat, headache, nausea that is been ongoing for the past 2 days.  Reports she does have a grandchild at home who is 33 months old, who is currently sick as well.  Has had a runny nose, congestion, cough.  She does endorse a sore throat which she has been taking chamomile for without any improvement in her symptoms.  She read to the ED febrile with a low-grade temp of 100.4.  Does endorse tobacco use, has not been able to smoke due to doing so badly.  No shortness of breath, no abdominal pain, no vomiting.  The history is provided by the patient.  Sore Throat Pertinent negatives include no chest pain and no shortness of breath.       Home Medications Prior to Admission medications   Medication Sig Start Date End Date Taking? Authorizing Provider  cyclobenzaprine  (FLEXERIL ) 10 MG tablet Take 1 tablet (10 mg total) by mouth 2 (two) times daily as needed for muscle spasms. 08/28/23   Mordecai Applebaum, MD  meloxicam  (MOBIC ) 7.5 MG tablet Take 1 tablet (7.5 mg total) by mouth daily. 09/08/23   Kendrick Pax, PA-C      Allergies    Patient has no known allergies.    Review of Systems   Review of Systems  Constitutional:  Positive for fever.  Respiratory:  Negative for shortness of breath.   Cardiovascular:  Negative for chest pain.  Gastrointestinal:  Positive for nausea.  Musculoskeletal:  Positive for myalgias.    Physical Exam Updated Vital Signs BP 118/75 (BP Location: Right Arm)   Pulse 97   Temp (!) 101.4 F (38.6 C) (Oral)   Resp 19   SpO2 100%  Physical Exam Vitals and nursing note reviewed.  Constitutional:       Appearance: She is well-developed.  HENT:     Head: Normocephalic and atraumatic.     Mouth/Throat:     Mouth: Mucous membranes are moist.     Tonsils: Tonsillar exudate present.     Comments: Mild bilateral tonsillar exudates noted. Cardiovascular:     Rate and Rhythm: Normal rate.  Pulmonary:     Effort: Pulmonary effort is normal.     Breath sounds: No wheezing.     Comments: Lungs are diminished but no obvious wheezing or rales. Abdominal:     Palpations: Abdomen is soft.  Musculoskeletal:     Cervical back: Normal range of motion and neck supple.  Skin:    General: Skin is warm and dry.  Neurological:     Mental Status: She is alert and oriented to person, place, and time.     ED Results / Procedures / Treatments   Labs (all labs ordered are listed, but only abnormal results are displayed) Labs Reviewed  GROUP A STREP BY PCR - Abnormal; Notable for the following components:      Result Value   Group A Strep by PCR DETECTED (*)    All other components within normal limits  RESP PANEL BY RT-PCR (RSV, FLU A&B, COVID)  [id number]    EKG None  Radiology  No results found.  Procedures Procedures    Medications Ordered in ED Medications  penicillin  g benzathine (BICILLIN LA) 1200000 UNIT/2ML injection 1.2 Million Units (has no administration in time range)  acetaminophen  (TYLENOL ) tablet 1,000 mg (1,000 mg Oral Given 09/18/23 1813)  alum & mag hydroxide-simeth (MAALOX/MYLANTA) 200-200-20 MG/5ML suspension 30 mL (30 mLs Oral Given 09/18/23 1835)    And  lidocaine  (XYLOCAINE ) 2 % viscous mouth solution 15 mL (15 mLs Oral Given 09/18/23 1835)  predniSONE  (DELTASONE ) tablet 60 mg (60 mg Oral Given 09/18/23 1837)    ED Course/ Medical Decision Making/ A&P Clinical Course as of 09/18/23 1931  Thu Sep 18, 2023  1916 Group A Strep by PCR(!): DETECTED [JS]    Clinical Course User Index [JS] Jobany Montellano, PA-C                                 Medical Decision Making Amount  and/or Complexity of Data Reviewed Labs:  Decision-making details documented in ED Course.  Risk OTC drugs. Prescription drug management.   Reported to the ED with nausea, sore throat, cough and fever for the past 2 days.  Does have a small child at home who is currently sick with a URI as well.  Has been taking, meal to help with her sore throat without any improvement in symptoms.  Arrived to the ED with a fever at 100.4, given Tylenol  to help with pyrexia.  Patient's strep pharyngitis test is positive on today's visit.  A review of her chart previously had episode of sore throat, she was given penicillin  in the past which she tolerated adequately.  Respiratory panel is currently in process.  I not suspect a superimposed infection.  She will be treated with penicillin  on today's visit, I did discuss with her she would likely feel better in the next 2 to 3 days.  Will need to continue to manage her fever at home with Tylenol  versus ibuprofen .  She is agreeable to plan and treatment, return precautions discussed at length.  Patient stable for discharge.   Portions of this note were generated with Scientist, clinical (histocompatibility and immunogenetics). Dictation errors may occur despite best attempts at proofreading.   Final Clinical Impression(s) / ED Diagnoses Final diagnoses:  Viral illness  Strep pharyngitis    Rx / DC Orders ED Discharge Orders     None         Kree Armato, PA-C 09/18/23 1931    Kommor, Alyse July, MD 09/19/23 0139

## 2023-09-18 NOTE — ED Triage Notes (Signed)
 Pt came in for sore throat, hot/cold flashes, headaches, and nausea for 2 days.

## 2023-09-18 NOTE — Discharge Instructions (Addendum)
 Your strep pharyngitis test was positive on today's visit, you received 1 injection of penicillin .   You should begin to improvement in his symptoms within 2 to 3 days.  Continue to treat your fever with Tylenol  versus ibuprofen .
